# Patient Record
Sex: Female | Born: 1970
Health system: Southern US, Community
[De-identification: ages and names within clinical notes are randomized; demographics above are authoritative.]

## PROBLEM LIST (undated history)

## (undated) DIAGNOSIS — K219 Gastro-esophageal reflux disease without esophagitis: Secondary | ICD-10-CM

## (undated) DIAGNOSIS — E079 Disorder of thyroid, unspecified: Secondary | ICD-10-CM

## (undated) DIAGNOSIS — I10 Essential (primary) hypertension: Secondary | ICD-10-CM

## (undated) HISTORY — DX: Essential (primary) hypertension: I10

## (undated) HISTORY — DX: Gastro-esophageal reflux disease without esophagitis: K21.9

## (undated) HISTORY — DX: Disorder of thyroid, unspecified: E07.9

## (undated) HISTORY — PX: NO PAST SURGERIES: SHX2092

---

## 2020-08-27 ENCOUNTER — Ambulatory Visit (INDEPENDENT_AMBULATORY_CARE_PROVIDER_SITE_OTHER): Payer: No Typology Code available for payment source | Admitting: Medical-Surgical

## 2020-08-27 ENCOUNTER — Other Ambulatory Visit (HOSPITAL_COMMUNITY)
Admission: RE | Admit: 2020-08-27 | Discharge: 2020-08-27 | Disposition: A | Discharge status: Home / Self Care | Payer: No Typology Code available for payment source | Source: Ambulatory Visit | Attending: Medical-Surgical | Admitting: Medical-Surgical

## 2020-08-27 ENCOUNTER — Other Ambulatory Visit: Payer: Self-pay

## 2020-08-27 ENCOUNTER — Encounter: Payer: Self-pay | Admitting: Medical-Surgical

## 2020-08-27 VITALS — BP 97/65 | HR 65 | Temp 98.5°F | Ht 60.5 in | Wt 96.7 lb

## 2020-08-27 DIAGNOSIS — Z124 Encounter for screening for malignant neoplasm of cervix: Secondary | ICD-10-CM | POA: Diagnosis not present

## 2020-08-27 DIAGNOSIS — Z7689 Persons encountering health services in other specified circumstances: Secondary | ICD-10-CM

## 2020-08-27 DIAGNOSIS — Z1231 Encounter for screening mammogram for malignant neoplasm of breast: Secondary | ICD-10-CM

## 2020-08-27 DIAGNOSIS — R21 Rash and other nonspecific skin eruption: Secondary | ICD-10-CM

## 2020-08-27 DIAGNOSIS — Z114 Encounter for screening for human immunodeficiency virus [HIV]: Secondary | ICD-10-CM | POA: Diagnosis not present

## 2020-08-27 DIAGNOSIS — Z789 Other specified health status: Secondary | ICD-10-CM

## 2020-08-27 DIAGNOSIS — Z1159 Encounter for screening for other viral diseases: Secondary | ICD-10-CM

## 2020-08-27 DIAGNOSIS — Z0000 Encounter for general adult medical examination without abnormal findings: Secondary | ICD-10-CM

## 2020-08-27 DIAGNOSIS — E039 Hypothyroidism, unspecified: Secondary | ICD-10-CM

## 2020-08-27 DIAGNOSIS — Z1211 Encounter for screening for malignant neoplasm of colon: Secondary | ICD-10-CM | POA: Diagnosis not present

## 2020-08-27 DIAGNOSIS — Z8639 Personal history of other endocrine, nutritional and metabolic disease: Secondary | ICD-10-CM

## 2020-08-27 MED ORDER — NYSTATIN 100000 UNIT/GM EX POWD: 1.0000 "application " | Freq: Three times a day (TID) | CUTANEOUS | 0 refills | Status: DC

## 2020-08-27 MED ORDER — CEPHALEXIN 500 MG PO CAPS: 500.0000 mg | ORAL_CAPSULE | Freq: Two times a day (BID) | ORAL | 0 refills | Status: DC

## 2020-08-27 NOTE — Patient Instructions (Signed)
Preventive Care 48-48 Years Old, Female Preventive care refers to lifestyle choices and visits with your health care provider that can promote health and wellness. This includes:  A yearly physical exam. This is also called an annual wellness visit.  Regular dental and eye exams.  Immunizations.  Screening for certain conditions.  Healthy lifestyle choices, such as: ? Eating a healthy diet. ? Getting regular exercise. ? Not using drugs or products that contain nicotine and tobacco. ? Limiting alcohol use. What can I expect for my preventive care visit? Physical exam Your health care provider will check your:  Height and weight. These may be used to calculate your BMI (body mass index). BMI is a measurement that tells if you are at a healthy weight.  Heart rate and blood pressure.  Body temperature.  Skin for abnormal spots. Counseling Your health care provider may ask you questions about your:  Past medical problems.  Family's medical history.  Alcohol, tobacco, and drug use.  Emotional well-being.  Home life and relationship well-being.  Sexual activity.  Diet, exercise, and sleep habits.  Work and work Statistician.  Access to firearms.  Method of birth control.  Menstrual cycle.  Pregnancy history. What immunizations do I need? Vaccines are usually given at various ages, according to a schedule. Your health care provider will recommend vaccines for you based on your age, medical history, and lifestyle or other factors, such as travel or where you work.   What tests do I need? Blood tests  Lipid and cholesterol levels. These may be checked every 5 years, or more often if you are over 3 years old.  Hepatitis C test.  Hepatitis B test. Screening  Lung cancer screening. You may have this screening every year starting at age 48 if you have a 30-pack-year history of smoking and currently smoke or have quit within the past 15 years.  Colorectal cancer  screening. ? All adults should have this screening starting at age 48 and continuing until age 17. ? Your health care provider may recommend screening at age 48 if you are at increased risk. ? You will have tests every 1-10 years, depending on your results and the type of screening test.  Diabetes screening. ? This is done by checking your blood sugar (glucose) after you have not eaten for a while (fasting). ? You may have this done every 1-3 years.  Mammogram. ? This may be done every 1-2 years. ? Talk with your health care provider about when you should start having regular mammograms. This may depend on whether you have a family history of breast cancer.  BRCA-related cancer screening. This may be done if you have a family history of breast, ovarian, tubal, or peritoneal cancers.  Pelvic exam and Pap test. ? This may be done every 3 years starting at age 48. ? Starting at age 48, this may be done every 5 years if you have a Pap test in combination with an HPV test. Other tests  STD (sexually transmitted disease) testing, if you are at risk.  Bone density scan. This is done to screen for osteoporosis. You may have this scan if you are at high risk for osteoporosis. Talk with your health care provider about your test results, treatment options, and if necessary, the need for more tests. Follow these instructions at home: Eating and drinking  Eat a diet that includes fresh fruits and vegetables, whole grains, lean protein, and low-fat dairy products.  Take vitamin and mineral supplements  as recommended by your health care provider.  Do not drink alcohol if: ? Your health care provider tells you not to drink. ? You are pregnant, may be pregnant, or are planning to become pregnant.  If you drink alcohol: ? Limit how much you have to 0-1 drink a day. ? Be aware of how much alcohol is in your drink. In the [place] one drink equals one 12 oz bottle of beer (355 mL), one 5 oz glass of  wine (148 mL), or one 1 oz glass of hard liquor (44 mL).   Lifestyle  Take daily care of your teeth and gums. Brush your teeth every morning and night with fluoride toothpaste. Floss one time each day.  Stay active. Exercise for at least 30 minutes 5 or more days each week.  Do not use any products that contain nicotine or tobacco, such as cigarettes, e-cigarettes, and chewing tobacco. If you need help quitting, ask your health care provider.  Do not use drugs.  If you are sexually active, practice safe sex. Use a condom or other form of protection to prevent STIs (sexually transmitted infections).  If you do not wish to become pregnant, use a form of birth control. If you plan to become pregnant, see your health care provider for a prepregnancy visit.  If told by your health care provider, take low-dose aspirin daily starting at age 48.  Find healthy ways to cope with stress, such as: ? Meditation, yoga, or listening to music. ? Journaling. ? Talking to a trusted person. ? Spending time with friends and family. Safety  Always wear your seat belt while driving or riding in a vehicle.  Do not drive: ? If you have been drinking alcohol. Do not ride with someone who has been drinking. ? When you are tired or distracted. ? While texting.  Wear a helmet and other protective equipment during sports activities.  If you have firearms in your house, make sure you follow all gun safety procedures. What's next?  Visit your health care provider once a year for an annual wellness visit.  Ask your health care provider how often you should have your eyes and teeth checked.  Stay up to date on all vaccines. This information is not intended to replace advice given to you by your health care provider. Make sure you discuss any questions you have with your health care provider. Document Revised: 03/12/2020 Document Reviewed: 02/17/2018 Elsevier Patient Education  2021 Elsevier Inc.  

## 2020-08-27 NOTE — Progress Notes (Signed)
New Patient Office Visit  Subjective:  Patient ID: Jocelyn Lewis, female    DOB: [birth year]  Age: 50 y.o. MRN: [id number]  CC:  Chief Complaint  Patient presents with   Establish Care    HPI Jocelyn Lewis presents to establish care.   Hypothyroidism- treated with levothyroxine 59mcg daily for the last 3-4 years.  Has TSH checked at least once yearly.  History of iron deficiency.  Takes iron several times weekly by mouth.  History of vitamin D deficiency.  Takes vitamin D approximately 3 days/week.  Dentist: last about a year ago Eye exam: overdue, has not had one, on the to do list Diet: Strict vegetarian, no meat, no eggs, no seafood Pap smear: due, done today Mammogram: due, ordered today Colon cancer screening: prefers to defer today COVID: done, no booster yesterday  Past Medical History:  Diagnosis Date   Thyroid disease     Past Surgical History:  Procedure Laterality Date   NO PAST SURGERIES     Family History  Problem Relation Age of Onset   Diabetes Other    Hypertension Other    Social History   Socioeconomic History   Marital status: Married    Spouse name: Not on file   Number of children: Not on file   Years of education: Not on file   Highest education level: Not on file  Occupational History   Not on file  Tobacco Use   Smoking status: Never Smoker   Smokeless tobacco: Never Used  Vaping Use   Vaping Use: Never used  Substance and Sexual Activity   Alcohol use: Never   Drug use: Never   Sexual activity: Yes    Partners: Male    Birth control/protection: Surgical    Comment: husband had a vasectomy  Other Topics Concern   Not on file  Social History Narrative   Not on file   Social Determinants of Health   Financial Resource Strain: Not on file  Food Insecurity: Not on file  Transportation Needs: Not on file  Physical Activity: Not on file  Stress: Not on file  Social Connections: Not on file  Intimate Partner  Violence: Not on file    ROS Review of Systems  Constitutional: Negative for chills, fatigue, fever and unexpected weight change.  Respiratory: Negative for cough, chest tightness, shortness of breath and wheezing.   Cardiovascular: Negative for chest pain, palpitations and leg swelling.  Gastrointestinal: Negative for abdominal pain, constipation, diarrhea, nausea and vomiting.  Endocrine: Positive for cold intolerance. Negative for heat intolerance, polydipsia, polyphagia and polyuria.  Genitourinary: Negative for dysuria, frequency and urgency.  Allergic/Immunologic: Negative for environmental allergies and food allergies.  Neurological: Negative for dizziness, light-headedness and headaches.  Hematological: Does not bruise/bleed easily.  Psychiatric/Behavioral: Negative for dysphoric mood, self-injury, sleep disturbance and suicidal ideas. The patient is not nervous/anxious.     Objective:   Today's Vitals: BP 97/65    Pulse 65    Temp 98.5 F (36.9 C)    Ht 5' 0.5" (1.537 m)    Wt 96 lb 11.2 oz (43.9 kg)    LMP 08/16/2020    SpO2 100%    BMI 18.57 kg/m   Physical Exam Vitals and nursing note reviewed. Exam conducted with a chaperone present.  Constitutional:      General: She is not in acute distress.    Appearance: Normal appearance. She is not ill-appearing.  HENT:     Head: Normocephalic and atraumatic.  Right Ear: Tympanic membrane, ear canal and external ear normal. There is no impacted cerumen.     Left Ear: Tympanic membrane, ear canal and external ear normal. There is no impacted cerumen.     Nose: Nose normal. No congestion or rhinorrhea.     Right Turbinates: Not swollen.     Left Turbinates: Not swollen.     Right Sinus: No maxillary sinus tenderness or frontal sinus tenderness.     Left Sinus: No maxillary sinus tenderness or frontal sinus tenderness.     Mouth/Throat:     Mouth: Mucous membranes are moist. No oral lesions.     Pharynx: No oropharyngeal  exudate or posterior oropharyngeal erythema.     Tonsils: No tonsillar exudate or tonsillar abscesses.  Eyes:     General:        Right eye: No discharge.        Left eye: No discharge.     Extraocular Movements: Extraocular movements intact.     Conjunctiva/sclera: Conjunctivae normal.     Pupils: Pupils are equal, round, and reactive to light.  Neck:     Thyroid: No thyromegaly.     Vascular: No carotid bruit.  Cardiovascular:     Rate and Rhythm: Normal rate and regular rhythm.     Pulses: Normal pulses.     Heart sounds: Normal heart sounds. No murmur heard. No friction rub. No gallop.   Pulmonary:     Effort: Pulmonary effort is normal. No respiratory distress.     Breath sounds: Normal breath sounds. No decreased breath sounds or wheezing.  Chest:  Breasts: Breasts are symmetrical.     Right: Normal.     Left: Normal.    Abdominal:     General: Bowel sounds are normal. There is no distension.     Palpations: Abdomen is soft. There is no hepatomegaly, splenomegaly or mass.     Tenderness: There is no abdominal tenderness. There is no right CVA tenderness, left CVA tenderness, guarding or rebound.  Genitourinary:    General: Normal vulva.     Pubic Area: No rash.      Vagina: No vaginal discharge.     Cervix: No friability.     Uterus: Normal. Not tender and no uterine prolapse.      Adnexa: Right adnexa normal and left adnexa normal.       Right: No mass or tenderness.         Left: No mass or tenderness.       Rectum: Normal.  Musculoskeletal:        General: No swelling or tenderness. Normal range of motion.     Right shoulder: Normal.     Left shoulder: Normal.     Right upper arm: Normal.     Left upper arm: Normal.     Right elbow: Normal.     Left elbow: Normal.     Right forearm: Normal.     Left forearm: Normal.     Right wrist: Normal.     Left wrist: Normal.     Right hand: Normal.     Left hand: Normal.     Cervical back: Normal, normal range of  motion and neck supple. No tenderness.     Thoracic back: Normal.     Lumbar back: Normal.     Right lower leg: Normal. No edema.     Left lower leg: Normal. No edema.     Right ankle: Normal.  Right Achilles Tendon: Normal.     Left ankle: Normal.     Left Achilles Tendon: Normal.     Right foot: Normal.     Left foot: Normal.  Lymphadenopathy:     Cervical: No cervical adenopathy.  Skin:    General: Skin is warm and dry.     Coloration: Skin is not pale.     Findings: Rash (Bilateral axilla, erythematous, some flourescence under Woods lamp) present.  Neurological:     Mental Status: She is alert and oriented to person, place, and time.     Sensory: No sensory deficit.     Motor: No weakness.     Gait: Gait normal.     Deep Tendon Reflexes: Reflexes are normal and symmetric.  Psychiatric:        Attention and Perception: Attention normal.        Mood and Affect: Mood and affect normal.        Speech: Speech normal.        Behavior: Behavior normal.        Thought Content: Thought content normal.        Cognition and Memory: Cognition normal.        Judgment: Judgment normal.     Assessment & Plan:   1. Encounter to establish care Reviewed available information and discussed healthcare concerns with patient.  We are updating her on most of her preventive care today with the exception of colonoscopy which she would like to defer for now.  2. Screening for HIV (human immunodeficiency virus) Reports this has been done by occupational health so no need to add to blood work today.  3. Need for hepatitis C screening test Reports this has been done by occupational health so no need to add to blood work today.  4. Screen for colon cancer Discussed colonoscopy versus Cologuard.  She has no current symptoms and no family history and would like to defer this for now  5. Annual physical exam Checking CBC, CMP, and lipid panel today. - CBC with Differential/Platelet -  COMPLETE METABOLIC PANEL WITH GFR - Lipid panel  6. Cervical cancer screening Pap smear with HPV cotesting completed today - Cytology - PAP  7. Encounter for screening mammogram for malignant neoplasm of breast Mammogram ordered. - MM 3D SCREEN BREAST BILATERAL; Future  8. Acquired hypothyroidism Continue levothyroxine 88 mcg daily.  Checking TSH today - TSH  9. Skin rash Rash noted to bilateral axilla, red, irritated, and itchy.  Some patchy fluorescence under Woods lamp.  Treating with topical nystatin powder and oral Keflex for cellulitis/erysipelas.  Recommend limiting chemical use in the area until improvement occurs.  10. History of vitamin D deficiency Checking vitamin D.  Continue oral supplementation. - Vitamin D 1,25 dihydroxy  11. Strict vegetarian diet Checking vitamin B12 due to strict vegetarian diet. - Vitamin B12  12. History of iron deficiency Checking iron panel. - Fe+TIBC+Fer  Outpatient Encounter Medications as of 08/27/2020  Medication Sig   cephALEXin (KEFLEX) 500 MG capsule Take 1 capsule (500 mg total) by mouth 2 (two) times daily.   levothyroxine (SYNTHROID) 88 MCG tablet Take 88 mcg by mouth daily before breakfast.   nystatin (NYSTATIN) powder Apply 1 application topically 3 (three) times daily.   No facility-administered encounter medications on file as of 08/27/2020.    Follow-up: Return in about 1 year (around 08/27/2021) for annual physical exam or sooner if needed.   Clearnce Sorrel, DNP, APRN, FNP-BC [facility]

## 2020-08-28 LAB — CYTOLOGY - PAP
Comment: NEGATIVE
Diagnosis: NEGATIVE
High risk HPV: NEGATIVE

## 2020-08-30 ENCOUNTER — Encounter: Payer: Self-pay | Admitting: Medical-Surgical

## 2020-08-30 ENCOUNTER — Other Ambulatory Visit: Payer: Self-pay | Admitting: Medical-Surgical

## 2020-08-30 DIAGNOSIS — R17 Unspecified jaundice: Secondary | ICD-10-CM

## 2020-08-30 LAB — COMPLETE METABOLIC PANEL WITH GFR
AG Ratio: 1.7 (calc) (ref 1.0–2.5)
ALT: 17 U/L (ref 6–29)
AST: 20 U/L (ref 10–35)
Albumin: 4.5 g/dL (ref 3.6–5.1)
Alkaline phosphatase (APISO): 45 U/L (ref 31–125)
BUN: 12 mg/dL (ref 7–25)
CO2: 30 mmol/L (ref 20–32)
Calcium: 10.2 mg/dL (ref 8.6–10.2)
Chloride: 101 mmol/L (ref 98–110)
Creat: 0.61 mg/dL (ref 0.50–1.10)
GFR, Est African American: 125 mL/min/{1.73_m2} (ref >=60)
GFR, Est Non African American: 108 mL/min/{1.73_m2} (ref >=60)
Globulin: 2.7 g/dL (calc) (ref 1.9–3.7)
Glucose, Bld: 89 mg/dL (ref 65–99)
Potassium: 4.9 mmol/L (ref 3.5–5.3)
Sodium: 138 mmol/L (ref 135–146)
Total Bilirubin: 1.8 mg/dL — ABNORMAL HIGH (ref 0.2–1.2)
Total Protein: 7.2 g/dL (ref 6.1–8.1)

## 2020-08-30 LAB — CBC WITH DIFFERENTIAL/PLATELET
Absolute Monocytes: 435 cells/uL (ref 200–950)
Basophils Absolute: 41 cells/uL (ref 0–200)
Basophils Relative: 1 %
Eosinophils Absolute: 70 cells/uL (ref 15–500)
Eosinophils Relative: 1.7 %
HCT: 43.3 % (ref 35.0–45.0)
Hemoglobin: 14.8 g/dL (ref 11.7–15.5)
Lymphs Abs: 1784 cells/uL (ref 850–3900)
MCH: 32 pg (ref 27.0–33.0)
MCHC: 34.2 g/dL (ref 32.0–36.0)
MCV: 93.7 fL (ref 80.0–100.0)
MPV: 10.6 fL (ref 7.5–12.5)
Monocytes Relative: 10.6 %
Neutro Abs: 1771 cells/uL (ref 1500–7800)
Neutrophils Relative %: 43.2 %
Platelets: 290 10*3/uL (ref 140–400)
RBC: 4.62 10*6/uL (ref 3.80–5.10)
RDW: 11.4 % (ref 11.0–15.0)
Total Lymphocyte: 43.5 %
WBC: 4.1 10*3/uL (ref 3.8–10.8)

## 2020-08-30 LAB — LIPID PANEL
Cholesterol: 198 mg/dL (ref <200)
HDL: 78 mg/dL (ref >=50)
LDL Cholesterol (Calc): 105 mg/dL (calc) — ABNORMAL HIGH
Non-HDL Cholesterol (Calc): 120 mg/dL (calc) (ref <130)
Total CHOL/HDL Ratio: 2.5 (calc) (ref <5.0)
Triglycerides: 63 mg/dL (ref <150)

## 2020-08-30 LAB — TSH: TSH: 0.31 mIU/L — ABNORMAL LOW

## 2020-08-30 MED ORDER — LEVOTHYROXINE SODIUM 75 MCG PO TABS: 75.0000 ug | ORAL_TABLET | Freq: Every day | ORAL | 0 refills | Status: DC

## 2020-08-30 NOTE — Addendum Note (Signed)
Addended bySamuel Bouche on: 08/30/2020 07:10 AM   Modules accepted: Orders

## 2020-09-02 LAB — IRON,TIBC AND FERRITIN PANEL
%SAT: 49 % (calc) — ABNORMAL HIGH (ref 16–45)
Ferritin: 35 ng/mL (ref 16–232)
Iron: 181 ug/dL (ref 40–190)
TIBC: 373 mcg/dL (calc) (ref 250–450)

## 2020-09-02 LAB — VITAMIN D 1,25 DIHYDROXY
Vitamin D 1, 25 (OH)2 Total: 50 pg/mL (ref 18–72)
Vitamin D2 1, 25 (OH)2: <8 pg/mL
Vitamin D3 1, 25 (OH)2: 50 pg/mL

## 2020-09-02 LAB — VITAMIN B12: Vitamin B-12: 783 pg/mL (ref 200–1100)

## 2020-09-15 ENCOUNTER — Encounter: Payer: Self-pay | Admitting: Medical-Surgical

## 2020-09-23 ENCOUNTER — Encounter: Payer: Self-pay | Admitting: Medical-Surgical

## 2020-09-23 MED ORDER — NYSTATIN 100000 UNIT/GM EX POWD: 1.0000 "application " | Freq: Three times a day (TID) | CUTANEOUS | 0 refills | Status: DC

## 2020-11-25 ENCOUNTER — Other Ambulatory Visit: Payer: Self-pay

## 2020-11-25 ENCOUNTER — Ambulatory Visit (HOSPITAL_BASED_OUTPATIENT_CLINIC_OR_DEPARTMENT_OTHER)
Admission: RE | Admit: 2020-11-25 | Discharge: 2020-11-25 | Disposition: A | Discharge status: Home / Self Care | Payer: No Typology Code available for payment source | Source: Ambulatory Visit | Attending: Medical-Surgical | Admitting: Medical-Surgical

## 2020-11-25 ENCOUNTER — Encounter (HOSPITAL_BASED_OUTPATIENT_CLINIC_OR_DEPARTMENT_OTHER): Payer: Self-pay

## 2020-11-25 DIAGNOSIS — Z1231 Encounter for screening mammogram for malignant neoplasm of breast: Secondary | ICD-10-CM | POA: Diagnosis not present

## 2021-01-16 ENCOUNTER — Encounter: Payer: Self-pay | Admitting: Medical-Surgical

## 2021-01-17 LAB — COMPLETE METABOLIC PANEL WITH GFR
AG Ratio: 1.5 (calc) (ref 1.0–2.5)
ALT: 14 U/L (ref 6–29)
AST: 18 U/L (ref 10–35)
Albumin: 4.4 g/dL (ref 3.6–5.1)
Alkaline phosphatase (APISO): 44 U/L (ref 31–125)
BUN: 13 mg/dL (ref 7–25)
CO2: 31 mmol/L (ref 20–32)
Calcium: 10.3 mg/dL — ABNORMAL HIGH (ref 8.6–10.2)
Chloride: 101 mmol/L (ref 98–110)
Creat: 0.75 mg/dL (ref 0.50–0.99)
Globulin: 2.9 g/dL (calc) (ref 1.9–3.7)
Glucose, Bld: 66 mg/dL (ref 65–99)
Potassium: 4.3 mmol/L (ref 3.5–5.3)
Sodium: 139 mmol/L (ref 135–146)
Total Bilirubin: 1.3 mg/dL — ABNORMAL HIGH (ref 0.2–1.2)
Total Protein: 7.3 g/dL (ref 6.1–8.1)
eGFR: 98 mL/min/{1.73_m2} (ref >=60)

## 2021-01-17 LAB — TSH: TSH: 3.86 mIU/L

## 2021-01-18 ENCOUNTER — Other Ambulatory Visit: Payer: Self-pay | Admitting: Medical-Surgical

## 2021-01-18 MED ORDER — LEVOTHYROXINE SODIUM 75 MCG PO TABS
75.0000 ug | ORAL_TABLET | Freq: Every day | ORAL | 0 refills | Status: DC
  Filled 2021-01-18: qty 90, 90d supply, fill #0

## 2021-01-19 ENCOUNTER — Encounter: Payer: Self-pay | Admitting: Medical-Surgical

## 2021-01-20 ENCOUNTER — Other Ambulatory Visit: Payer: Self-pay | Admitting: Medical-Surgical

## 2021-01-20 ENCOUNTER — Other Ambulatory Visit (HOSPITAL_BASED_OUTPATIENT_CLINIC_OR_DEPARTMENT_OTHER): Payer: Self-pay

## 2021-01-20 MED ORDER — LEVOTHYROXINE SODIUM 50 MCG PO TABS
50.0000 ug | ORAL_TABLET | Freq: Every day | ORAL | 0 refills | Status: DC
  Filled 2021-01-20: qty 90, 90d supply, fill #0

## 2021-02-20 ENCOUNTER — Other Ambulatory Visit (HOSPITAL_BASED_OUTPATIENT_CLINIC_OR_DEPARTMENT_OTHER): Payer: Self-pay

## 2021-03-01 ENCOUNTER — Other Ambulatory Visit (HOSPITAL_BASED_OUTPATIENT_CLINIC_OR_DEPARTMENT_OTHER): Payer: Self-pay

## 2021-04-08 ENCOUNTER — Encounter: Payer: Self-pay | Admitting: Medical-Surgical

## 2021-04-08 DIAGNOSIS — E039 Hypothyroidism, unspecified: Secondary | ICD-10-CM

## 2021-04-15 ENCOUNTER — Other Ambulatory Visit: Payer: Self-pay | Admitting: Medical-Surgical

## 2021-04-15 ENCOUNTER — Encounter: Payer: Self-pay | Admitting: Medical-Surgical

## 2021-04-15 LAB — COMPLETE METABOLIC PANEL WITH GFR
AG Ratio: 1.5 (calc) (ref 1.0–2.5)
ALT: 19 U/L (ref 6–29)
AST: 21 U/L (ref 10–35)
Albumin: 4.7 g/dL (ref 3.6–5.1)
Alkaline phosphatase (APISO): 53 U/L (ref 31–125)
BUN: 13 mg/dL (ref 7–25)
CO2: 32 mmol/L (ref 20–32)
Calcium: 10.9 mg/dL — ABNORMAL HIGH (ref 8.6–10.2)
Chloride: 101 mmol/L (ref 98–110)
Creat: 0.7 mg/dL (ref 0.50–0.99)
Globulin: 3.1 g/dL (calc) (ref 1.9–3.7)
Glucose, Bld: 64 mg/dL — ABNORMAL LOW (ref 65–139)
Potassium: 4.2 mmol/L (ref 3.5–5.3)
Sodium: 140 mmol/L (ref 135–146)
Total Bilirubin: 1.5 mg/dL — ABNORMAL HIGH (ref 0.2–1.2)
Total Protein: 7.8 g/dL (ref 6.1–8.1)
eGFR: 107 mL/min/{1.73_m2} (ref >=60)

## 2021-04-15 LAB — TSH: TSH: 18.75 mIU/L — ABNORMAL HIGH

## 2021-04-15 MED ORDER — LEVOTHYROXINE SODIUM 75 MCG PO TABS: 75.0000 ug | ORAL_TABLET | Freq: Every day | ORAL | 0 refills | Status: DC

## 2021-05-17 ENCOUNTER — Emergency Department
Admission: EM | Admit: 2021-05-17 | Discharge: 2021-05-17 | Disposition: A | Discharge status: Home / Self Care | Payer: No Typology Code available for payment source | Source: Home / Self Care | Attending: Family Medicine | Admitting: Family Medicine

## 2021-05-17 ENCOUNTER — Other Ambulatory Visit: Payer: Self-pay

## 2021-05-17 ENCOUNTER — Encounter: Payer: Self-pay | Admitting: Emergency Medicine

## 2021-05-17 DIAGNOSIS — R058 Other specified cough: Secondary | ICD-10-CM

## 2021-05-17 DIAGNOSIS — J040 Acute laryngitis: Secondary | ICD-10-CM

## 2021-05-17 DIAGNOSIS — J111 Influenza due to unidentified influenza virus with other respiratory manifestations: Secondary | ICD-10-CM

## 2021-05-17 MED ORDER — BENZONATATE 200 MG PO CAPS: 200.0000 mg | ORAL_CAPSULE | Freq: Two times a day (BID) | ORAL | 0 refills | Status: DC | PRN

## 2021-05-17 MED ORDER — AZITHROMYCIN 250 MG PO TABS: 250.0000 mg | ORAL_TABLET | Freq: Every day | ORAL | 0 refills | Status: DC

## 2021-05-17 NOTE — ED Triage Notes (Signed)
Fever w/ body aches since last Saturday- lost her voice yesterday  Dry cough  OTC meds: mucinex, tylenol cold & flu, vit c, zinc & vitamin d Steam  & cough drops  Had a flu vaccine this year

## 2021-05-17 NOTE — ED Provider Notes (Signed)
[facility]    CSN: [id number] Arrival date & time: 05/17/21  1131      History   Chief Complaint Chief Complaint  Patient presents with   Fever   Hoarse    HPI Jocelyn Lewis is a 50 y.o. female.   HPI Patient is on her eighth day of her upper respiratory illness.  She has had cough cold runny nose and sore throat.  She has been using Mucinex, Tylenol Cold and flu, and a vitamin supplement.  She has used steam.  Cough drops.  Sipping on tea.  In spite of this her symptoms have not improved and yesterday she lost her voice.  She thinks it is from all the coughing.  She coughed so hard that her chest hurts.  She can speak barely above a whisper.  No one else at home is sick.  She is flu vaccinated.  She is COVID vaccinated.  Past Medical History:  Diagnosis Date   Thyroid disease     There are no problems to display for this patient.   Past Surgical History:  Procedure Laterality Date   NO PAST SURGERIES      OB History   No obstetric history on file.      Home Medications    Prior to Admission medications   Medication Sig Start Date End Date Taking? Authorizing Provider  azithromycin (ZITHROMAX) 250 MG tablet Take 1 tablet (250 mg total) by mouth daily. Take first 2 tablets together, then 1 every day until finished. 05/17/21  Yes Raylene Everts, MD  benzonatate (TESSALON) 200 MG capsule Take 1 capsule (200 mg total) by mouth 2 (two) times daily as needed for cough. 05/17/21  Yes Raylene Everts, MD  levothyroxine (SYNTHROID) 75 MCG tablet Take 1 tablet (75 mcg total) by mouth daily before breakfast. 04/15/21   Samuel Bouche, NP    Family History Family History  Problem Relation Age of Onset   Diabetes Other    Hypertension Other     Social History Social History   Tobacco Use   Smoking status: Never   Smokeless tobacco: Never  Vaping Use   Vaping Use: Never used  Substance Use Topics   Alcohol use: Never   Drug use: Never      Allergies   Patient has no known allergies.   Review of Systems Review of Systems See HPI  Physical Exam Triage Vital Signs ED Triage Vitals  Enc Vitals Group     BP 05/17/21 1301 111/75     Pulse Rate 05/17/21 1301 80     Resp 05/17/21 1301 17     Temp 05/17/21 1301 98.5 F (36.9 C)     Temp Source 05/17/21 1301 Oral     SpO2 05/17/21 1301 98 %     Weight 05/17/21 1302 93 lb (42.2 kg)     Height 05/17/21 1302 5' 0.5" (1.537 m)     Head Circumference --      Peak Flow --      Pain Score 05/17/21 1305 4     Pain Loc --      Pain Edu? --      Excl. in [place]? --    No data found.  Updated Vital Signs BP 111/75 (BP Location: Left Arm)   Pulse 80   Temp 98.5 F (36.9 C) (Oral)   Resp 17   Ht 5' 0.5" (1.537 m)   Wt 42.2 kg   LMP 05/03/2021 (Approximate)  SpO2 98%   BMI 17.86 kg/m      Physical Exam Constitutional:      General: She is not in acute distress.    Appearance: She is well-developed. She is ill-appearing.     Comments: Small woman.  Speaks in a whisper.  Appears tired  HENT:     Head: Normocephalic and atraumatic.     Right Ear: Tympanic membrane, ear canal and external ear normal.     Left Ear: Tympanic membrane, ear canal and external ear normal.     Nose: Nose normal. No congestion.     Mouth/Throat:     Pharynx: Posterior oropharyngeal erythema present.     Comments: Mouth slightly dry Eyes:     Conjunctiva/sclera: Conjunctivae normal.     Pupils: Pupils are equal, round, and reactive to light.  Cardiovascular:     Rate and Rhythm: Normal rate and regular rhythm.     Heart sounds: Normal heart sounds.  Pulmonary:     Effort: Pulmonary effort is normal. No respiratory distress.     Breath sounds: Normal breath sounds.  Abdominal:     General: There is no distension.     Palpations: Abdomen is soft.  Musculoskeletal:        General: Normal range of motion.     Cervical back: Normal range of motion.  Lymphadenopathy:     Cervical:  Cervical adenopathy present.  Skin:    General: Skin is warm and dry.  Neurological:     Mental Status: She is alert.  Psychiatric:        Mood and Affect: Mood normal.        Behavior: Behavior normal.     UC Treatments / Results  Labs (all labs ordered are listed, but only abnormal results are displayed) Labs Reviewed - No data to display  EKG   Radiology No results found.  Procedures Procedures (including critical care time)  Medications Ordered in UC Medications - No data to display  Initial Impression / Assessment and Plan / UC Course  I have reviewed the triage vital signs and the nursing notes.  Pertinent labs & imaging results that were available during my care of the patient were reviewed by me and considered in my medical decision making (see chart for details).     Final Clinical Impressions(s) / UC Diagnoses   Final diagnoses:  Influenza-like illness  Laryngitis, acute  Productive cough     Discharge Instructions      Make sure you are drinking lots of water Run a humidifier if you have 1 Try to talk as little as possible Take the antibiotic as directed.  2 pills today then 1 a day until gone Take the cough medicine 2 times a day.  This will help reduce your coughing and irritation in your throat    ED Prescriptions     Medication Sig Dispense Auth. Provider   benzonatate (TESSALON) 200 MG capsule Take 1 capsule (200 mg total) by mouth 2 (two) times daily as needed for cough. 20 capsule Raylene Everts, MD   azithromycin (ZITHROMAX) 250 MG tablet Take 1 tablet (250 mg total) by mouth daily. Take first 2 tablets together, then 1 every day until finished. 6 tablet Raylene Everts, MD      PDMP not reviewed this encounter.   Raylene Everts, MD 05/17/21 1345

## 2021-05-17 NOTE — Discharge Instructions (Signed)
Make sure you are drinking lots of water Run a humidifier if you have 1 Try to talk as little as possible Take the antibiotic as directed.  2 pills today then 1 a day until gone Take the cough medicine 2 times a day.  This will help reduce your coughing and irritation in your throat

## 2021-07-10 ENCOUNTER — Encounter: Payer: Self-pay | Admitting: Medical-Surgical

## 2021-07-10 DIAGNOSIS — E039 Hypothyroidism, unspecified: Secondary | ICD-10-CM

## 2021-07-16 LAB — PARATHYROID HORMONE, INTACT (NO CA): PTH: 63 pg/mL (ref 16–77)

## 2021-07-16 LAB — TSH: TSH: 1.75 mIU/L

## 2021-07-17 ENCOUNTER — Other Ambulatory Visit: Payer: Self-pay | Admitting: Medical-Surgical

## 2021-07-17 ENCOUNTER — Encounter: Payer: Self-pay | Admitting: Medical-Surgical

## 2021-07-17 MED ORDER — LEVOTHYROXINE SODIUM 75 MCG PO TABS: 75.0000 ug | ORAL_TABLET | Freq: Every day | ORAL | 1 refills | Status: DC

## 2021-10-03 ENCOUNTER — Ambulatory Visit (INDEPENDENT_AMBULATORY_CARE_PROVIDER_SITE_OTHER): Payer: No Typology Code available for payment source | Admitting: Gastroenterology

## 2021-10-03 ENCOUNTER — Encounter: Payer: Self-pay | Admitting: Gastroenterology

## 2021-10-03 VITALS — BP 104/72 | HR 82 | Ht 60.5 in | Wt 95.0 lb

## 2021-10-03 DIAGNOSIS — Z1212 Encounter for screening for malignant neoplasm of rectum: Secondary | ICD-10-CM | POA: Diagnosis not present

## 2021-10-03 DIAGNOSIS — R1319 Other dysphagia: Secondary | ICD-10-CM | POA: Diagnosis not present

## 2021-10-03 DIAGNOSIS — Z1211 Encounter for screening for malignant neoplasm of colon: Secondary | ICD-10-CM

## 2021-10-03 DIAGNOSIS — K219 Gastro-esophageal reflux disease without esophagitis: Secondary | ICD-10-CM

## 2021-10-03 MED ORDER — OMEPRAZOLE 20 MG PO CPDR: 20.0000 mg | DELAYED_RELEASE_CAPSULE | Freq: Every day | ORAL | 4 refills | Status: DC

## 2021-10-03 NOTE — Progress Notes (Signed)
? ? ?Chief Complaint: For GI eval ? ?Referring Provider:  Samuel Bouche, NP    ? ? ?ASSESSMENT AND PLAN;  ? ?#1. GERD with eso dysphagia ? ?#2. Colorectal cancer screening ? ?Plan: ?-Omeprazole '20mg'$  po QD #90, 4 RF.  She can take it half an hour before supper. ?-EGD with dil/colon for further eval ? ?Discussed procedures in detail. ?HPI:   ? ?Jocelyn Lewis is a 51 y.o. female  ?Very pleasant CMA, who is now vegan ?Husband Jocelyn Lewis is a patient of ours. ? ?Longstanding history of mild heartburn ?Now with mild dysphagia-has to eat slow, she feels " food going down slowly" when she swallows.  Mostly with solids.  Her coworkers would be done and she would still be eating. ?No odynophagia ?No weight loss ?No melena or hematochezia ?Was somewhat better when she was taking omeprazole-stopped due to concerns of interaction with Synthroid. ? ?She denies having any significant diarrhea or constipation.  Occasional bloating especially if she eats sweet foods. ?She is due for age-related colorectal cancer screening as well ? ?She is due for physical.  She has sent message to [facility] regarding that as well. ? ?Previously her calcium level was elevated.  PTH was normal. ?She is currently taking thyroid medications as well.  TSH being followed. ? ?Wt Readings from Last 3 Encounters:  ?10/03/21 95 lb (43.1 kg)  ?05/17/21 93 lb (42.2 kg)  ?08/27/20 96 lb 11.2 oz (43.9 kg)  ? ? ? ?Past Medical History:  ?Diagnosis Date  ? Thyroid disease   ? ? ?Past Surgical History:  ?Procedure Laterality Date  ? NO PAST SURGERIES    ? ? ?Family History  ?Problem Relation Age of Onset  ? Diabetes Other   ? Hypertension Other   ? Colon cancer Neg Hx   ? Esophageal cancer Neg Hx   ? ? ?Social History  ? ?Tobacco Use  ? Smoking status: Never  ? Smokeless tobacco: Never  ?Vaping Use  ? Vaping Use: Never used  ?Substance Use Topics  ? Alcohol use: Never  ? Drug use: Never  ? ? ?Current Outpatient Medications  ?Medication Sig Dispense Refill  ? levothyroxine  (SYNTHROID) 75 MCG tablet Take 1 tablet (75 mcg total) by mouth daily before breakfast. 90 tablet 1  ? ?No current facility-administered medications for this visit.  ? ? ?No Known Allergies ? ?Review of Systems:  ?Constitutional: Denies fever, chills, diaphoresis, appetite change and fatigue.  ?HEENT: Denies photophobia, eye pain, redness, hearing loss, ear pain, congestion, sore throat, rhinorrhea, sneezing, mouth sores, neck pain, neck stiffness and tinnitus.   ?Respiratory: Denies SOB, DOE, cough, chest tightness,  and wheezing.   ?Cardiovascular: Denies chest pain, palpitations and leg swelling.  ?Genitourinary: Denies dysuria, urgency, frequency, hematuria, flank pain and difficulty urinating.  ?Musculoskeletal: Denies myalgias, back pain, joint swelling, arthralgias and gait problem.  ?Skin: No rash.  ?Neurological: Denies dizziness, seizures, syncope, weakness, light-headedness, numbness and headaches.  ?Hematological: Denies adenopathy. Easy bruising, personal or family bleeding history  ?Psychiatric/Behavioral: No anxiety or depression ? ?  ? ?Physical Exam:   ? ?BP 104/72   Pulse 82   Ht 5' 0.5" (1.537 m)   Wt 95 lb (43.1 kg)   BMI 18.25 kg/m?  ?Wt Readings from Last 3 Encounters:  ?10/03/21 95 lb (43.1 kg)  ?05/17/21 93 lb (42.2 kg)  ?08/27/20 96 lb 11.2 oz (43.9 kg)  ? ?Constitutional:  Well-developed, in no acute distress. ?Psychiatric: Normal mood and affect. Behavior is normal. ?HEENT:  Pupils normal.  Conjunctivae are normal. No scleral icterus. ?Cardiovascular: Normal rate, regular rhythm. No edema ?Pulmonary/chest: Effort normal and breath sounds normal. No wheezing, rales or rhonchi. ?Abdominal: Soft, nondistended. Nontender. Bowel sounds active throughout. There are no masses palpable. No hepatomegaly. ?Rectal: Deferred ?Neurological: Alert and oriented to person place and time. ?Skin: Skin is warm and dry. No rashes noted. ? ?Data Reviewed: I have personally reviewed following labs and  imaging studies ? ?CBC: ? ?  Latest Ref Rng & Units 08/29/2020  ?  7:05 AM  ?CBC  ?WBC 3.8 - 10.8 Thousand/uL 4.1    ?Hemoglobin 11.7 - 15.5 g/dL 14.8    ?Hematocrit 35.0 - 45.0 % 43.3    ?Platelets 140 - 400 Thousand/uL 290    ? ? ?CMP: ? ?  Latest Ref Rng & Units 04/14/2021  ?  7:36 AM 01/17/2021  ?  7:20 AM 08/29/2020  ?  7:05 AM  ?CMP  ?Glucose 65 - 139 mg/dL 64   66   89    ?BUN 7 - 25 mg/dL '13   13   12    '$ ?Creatinine 0.50 - 0.99 mg/dL 0.70   0.75   0.61    ?Sodium 135 - 146 mmol/L 140   139   138    ?Potassium 3.5 - 5.3 mmol/L 4.2   4.3   4.9    ?Chloride 98 - 110 mmol/L 101   101   101    ?CO2 20 - 32 mmol/L 32   31   30    ?Calcium 8.6 - 10.2 mg/dL 10.9   10.3   10.2    ?Total Protein 6.1 - 8.1 g/dL 7.8   7.3   7.2    ?Total Bilirubin 0.2 - 1.2 mg/dL 1.5   1.3   1.8    ?AST 10 - 35 U/L '21   18   20    '$ ?ALT 6 - 29 U/L '19   14   17    '$ ? ? ? ? ? ?Carmell Austria, MD 10/03/2021, 9:25 AM ? ?Cc: Samuel Bouche, NP ? ? ?

## 2021-10-03 NOTE — Patient Instructions (Addendum)
If you are age 49 or older, your body mass index should be between 23-30. Your Body mass index is 18.25 kg/m?Marland Kitchen If this is out of the aforementioned range listed, please consider follow up with your Primary Care Provider. ? ?If you are age 73 or younger, your body mass index should be between 19-25. Your Body mass index is 18.25 kg/m?Marland Kitchen If this is out of the aformentioned range listed, please consider follow up with your Primary Care Provider.  ? ?________________________________________________________ ? ?The [facility] providers would like to encourage you to use [facility] to communicate with providers for non-urgent requests or questions.  Due to long hold times on the telephone, sending your provider a message by [facility] may be a faster and more efficient way to get a response.  Please allow 48 business hours for a response.  Please remember that this is for non-urgent requests.  ?_______________________________________________________ ? ?We have sent the following medications to your pharmacy for you to pick up at your convenience: ?Omeprazole ? ?Thank you, ? ?Dr. Jackquline Denmark ? ? ? ? ? ?We want to thank you for trusting [facility] with your care. All of our staff and providers value the relationships we have built with our patients, and it is an honor to care for you.  ? ?We are writing to let you know that [facility] will close on Nov 03, 2021, and we invite you to continue to see Dr. Carmell Austria and Gerrit Heck at the [facility] [place] office location. We are consolidating our serices at these [facility] practices to better provide care. Our office staff will work with you to ensure a seamless transition.  ? ?Gerrit Heck, DO -Dr. Bryan Lemma will be movig to [facility] at [address], [place], [place] [address], effective Nov 03, 2021.  Contact [phone] to schedule an appointment with him.  ? ?Carmell Austria, MD- Dr. Lyndel Safe will  be movig to [facility] at [address], [place], [place] [address], effective Nov 03, 2021.  Contact [phone] to schedule an appointment with him.  ? ?Requesting Medical Records ?If you need to request your medical records, please follow the instructions below. Your medical records are confidential, and a copy can be transferred to another provider or released to you or another person you designate only with your permission. ? ?There are several ways to request your medical records: ?Requests for medical records can be submitted through our practice.   ?You can also request your records electronically, in your MyChart account by selecting the ?Request Health Records? tab.  ?If you need additional information on how to request records, please go to [email or web address]/, choose Patient Information, then select Request Medical Records. ?To make an appointment or if you have any questions about your health care needs, please contact our office at [phone] and one of our staff members will be glad to assist you. ?[facility] is committed to providing exceptional care for you and our community. Thank you for allowing [place] to serve your health care needs. ?Sincerely, ? ?Windy Canny, Director [facility] ? also offers convenient virtual care options. Sore throat? Sinus problems? Cold or flu symptoms? Get care from the comfort of home with [facility] Video Visits and e-Visits. Learn more about the non-emergency conditions treated and start your virtual visit at [email or web address] ? ?

## 2021-10-06 ENCOUNTER — Other Ambulatory Visit: Payer: Self-pay

## 2021-10-06 MED ORDER — LEVOTHYROXINE SODIUM 75 MCG PO TABS: 75.0000 ug | ORAL_TABLET | Freq: Every day | ORAL | 0 refills | Status: DC

## 2021-10-10 ENCOUNTER — Ambulatory Visit: Payer: No Typology Code available for payment source | Admitting: Gastroenterology

## 2021-10-14 ENCOUNTER — Telehealth: Payer: Self-pay | Admitting: Medical-Surgical

## 2021-10-14 DIAGNOSIS — Z1231 Encounter for screening mammogram for malignant neoplasm of breast: Secondary | ICD-10-CM

## 2021-10-14 NOTE — Telephone Encounter (Signed)
Mammogram ordered

## 2021-10-14 NOTE — Telephone Encounter (Signed)
Patient requested a mammogram referral. ?

## 2021-10-16 IMAGING — MG MM DIGITAL SCREENING BILAT W/ TOMO AND CAD
8 series · 9 of 24 positions shown · non-contrast
Comparison: None.

CLINICAL DATA: Screening.

EXAM:
DIGITAL SCREENING BILATERAL MAMMOGRAM WITH TOMOSYNTHESIS AND CAD
TECHNIQUE: Bilateral screening digital craniocaudal and mediolateral oblique
mammograms were obtained. Bilateral screening digital breast
tomosynthesis was performed. The images were evaluated with
computer-aided detection.

[L CC synth-2D]
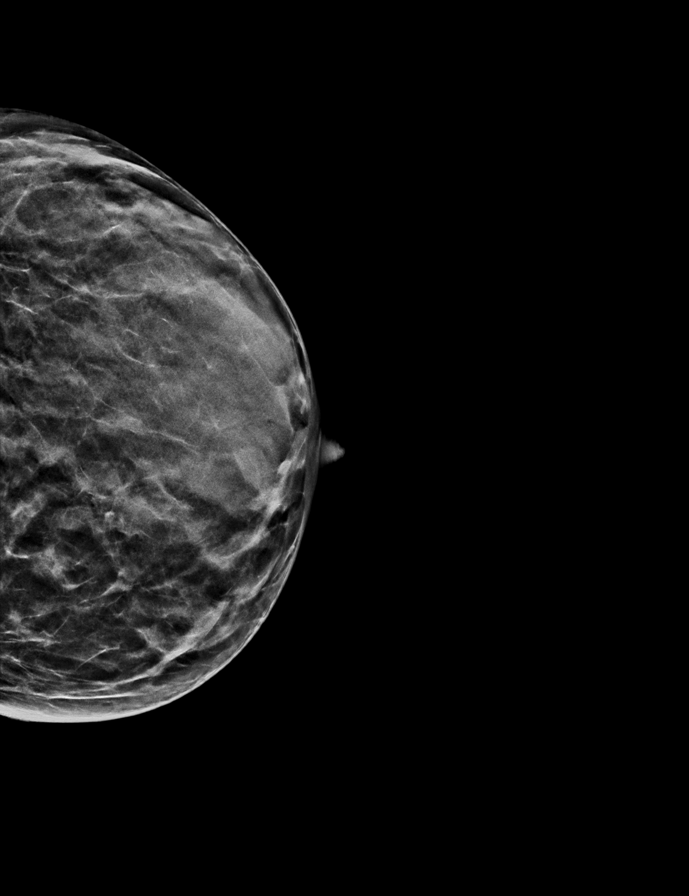

[R CC synth-2D]
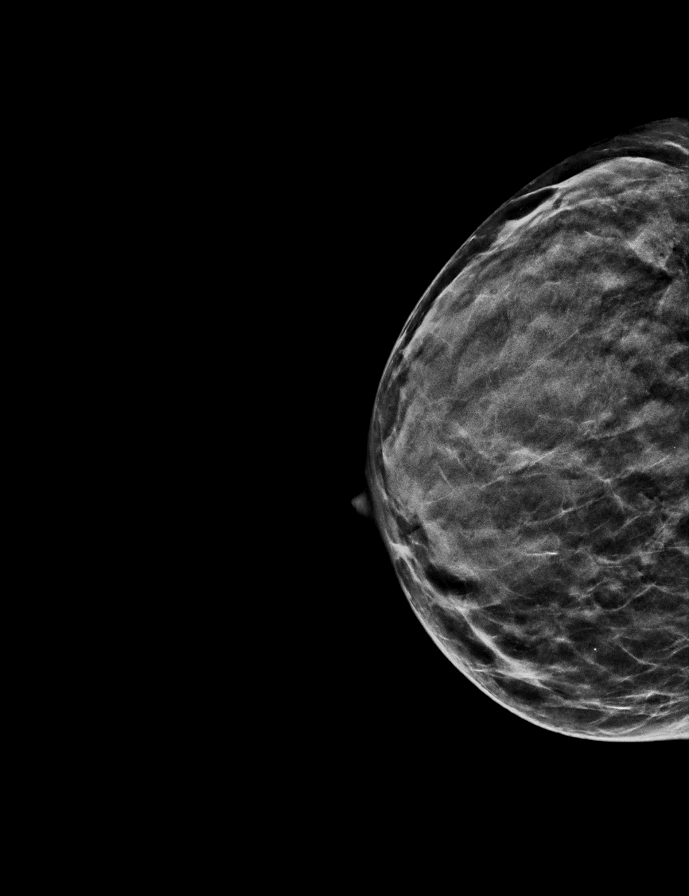

[L MLO synth-2D]
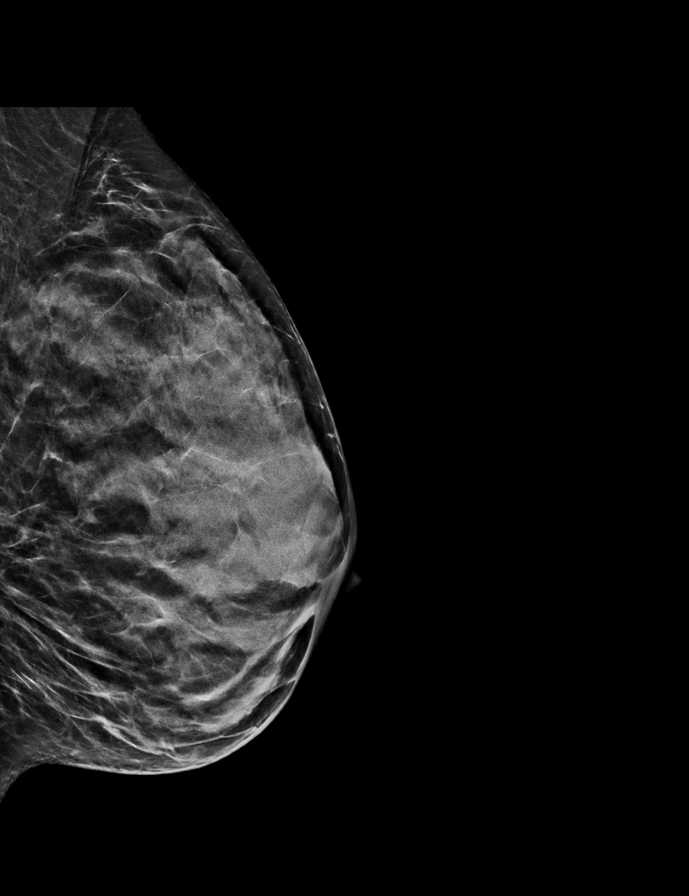

[R MLO synth-2D]
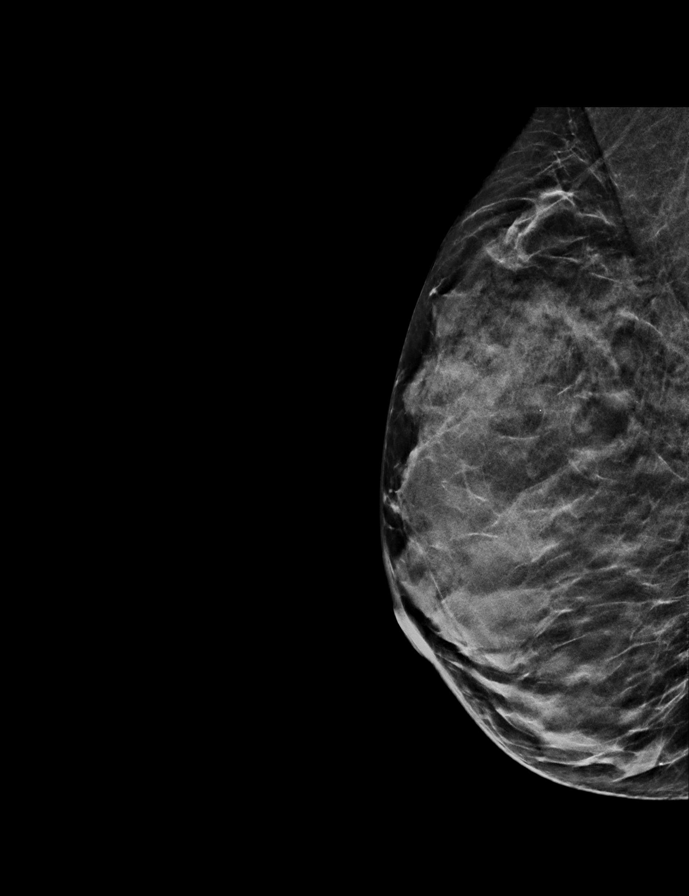

[R MLO tomo · 2 of 42 frames shown]
[frame 14/42]
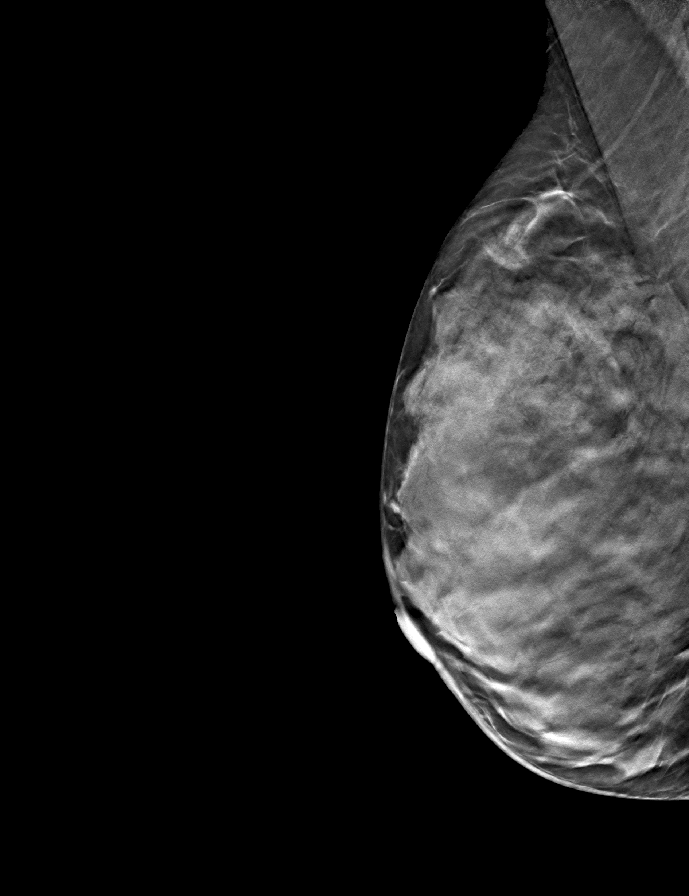
[frame 21/42]
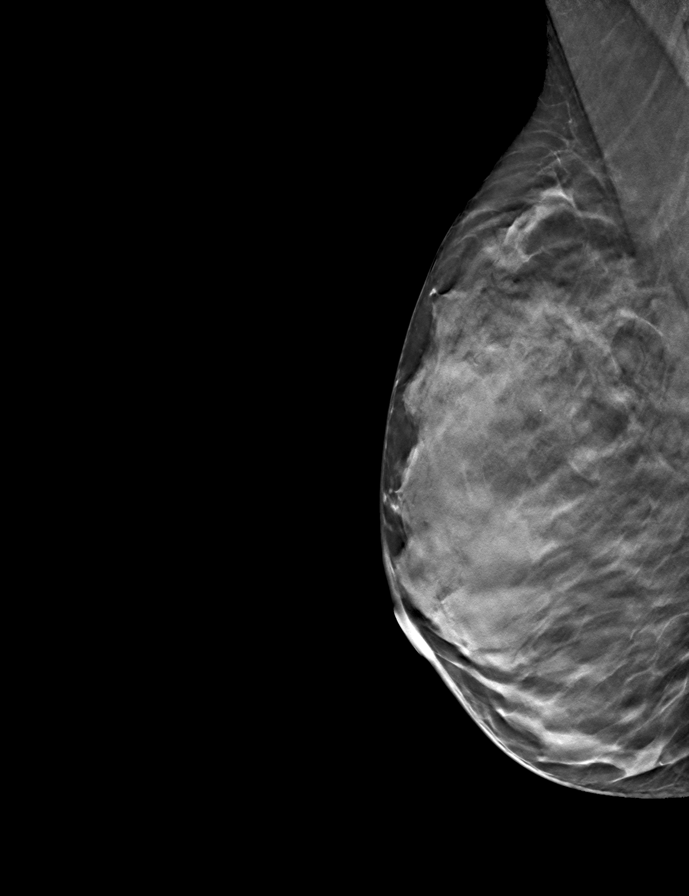

[L MLO tomo · tomo slice 23/45.0]
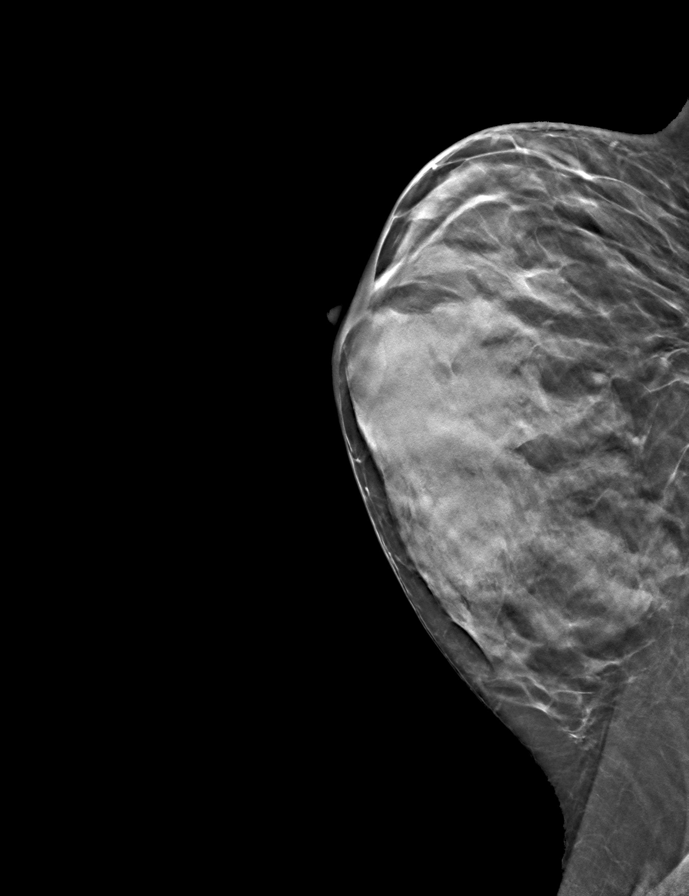

[R CC tomo · tomo slice 21/42.0]
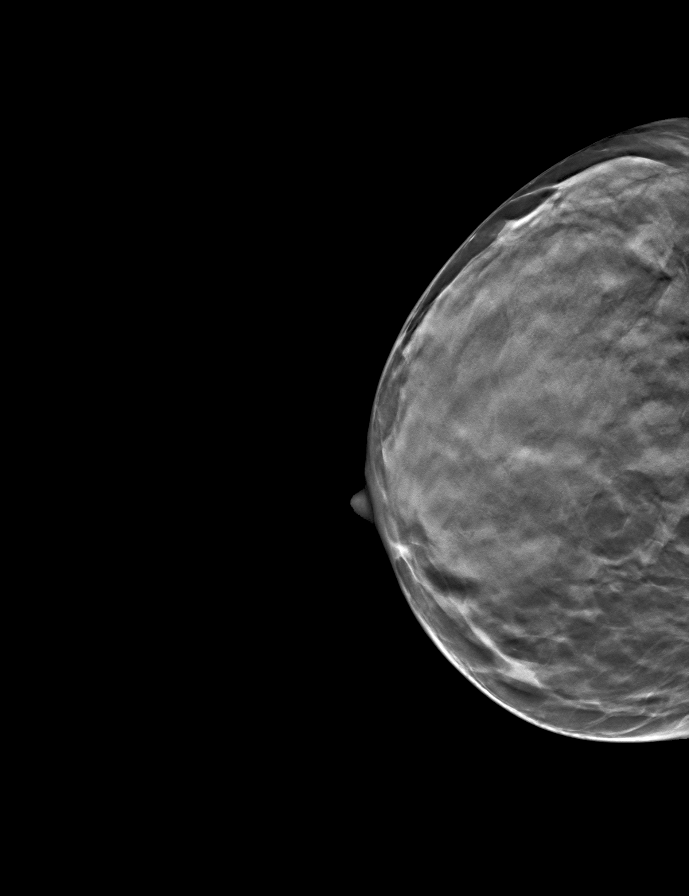

[L CC tomo · tomo slice 21/42.0]
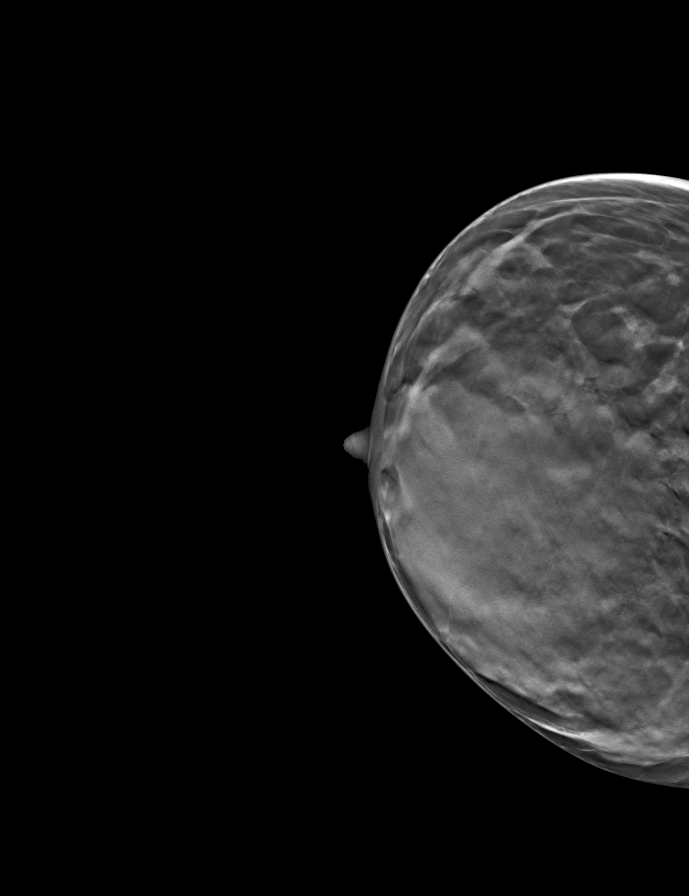

[9 of 24 positions shown; findings below may reference images not displayed]

ACR Breast Density Category d: The breast tissue is extremely dense,
which lowers the sensitivity of mammography.
FINDINGS: There are no findings suspicious for malignancy. The images were
evaluated with computer-aided detection.
IMPRESSION: No mammographic evidence of malignancy. A result letter of this
screening mammogram will be mailed directly to the patient.

RECOMMENDATION:
Screening mammogram in one year. (Code:CA-B-HJO)

BI-RADS CATEGORY  1: Negative.

## 2021-10-20 ENCOUNTER — Other Ambulatory Visit: Payer: Self-pay

## 2021-10-20 DIAGNOSIS — R1319 Other dysphagia: Secondary | ICD-10-CM

## 2021-10-20 DIAGNOSIS — K219 Gastro-esophageal reflux disease without esophagitis: Secondary | ICD-10-CM

## 2021-10-20 DIAGNOSIS — Z1211 Encounter for screening for malignant neoplasm of colon: Secondary | ICD-10-CM

## 2021-11-20 HISTORY — PX: COLONOSCOPY: SHX174

## 2021-12-01 ENCOUNTER — Other Ambulatory Visit (HOSPITAL_COMMUNITY): Payer: Self-pay

## 2021-12-01 ENCOUNTER — Other Ambulatory Visit: Payer: Self-pay

## 2021-12-01 MED ORDER — OMEPRAZOLE 20 MG PO CPDR
20.0000 mg | DELAYED_RELEASE_CAPSULE | Freq: Every day | ORAL | 4 refills | Status: DC
  Filled 2021-12-01: qty 90, 90d supply, fill #0

## 2021-12-15 ENCOUNTER — Ambulatory Visit (AMBULATORY_SURGERY_CENTER): Payer: No Typology Code available for payment source | Admitting: Gastroenterology

## 2021-12-15 ENCOUNTER — Encounter: Payer: Self-pay | Admitting: Gastroenterology

## 2021-12-15 VITALS — BP 103/65 | HR 66 | Temp 97.8°F | Resp 16 | Ht 60.0 in | Wt 95.0 lb

## 2021-12-15 DIAGNOSIS — K2970 Gastritis, unspecified, without bleeding: Secondary | ICD-10-CM | POA: Diagnosis not present

## 2021-12-15 DIAGNOSIS — Z1212 Encounter for screening for malignant neoplasm of rectum: Secondary | ICD-10-CM | POA: Diagnosis not present

## 2021-12-15 DIAGNOSIS — Z1211 Encounter for screening for malignant neoplasm of colon: Secondary | ICD-10-CM

## 2021-12-15 DIAGNOSIS — D122 Benign neoplasm of ascending colon: Secondary | ICD-10-CM

## 2021-12-15 DIAGNOSIS — R1310 Dysphagia, unspecified: Secondary | ICD-10-CM | POA: Diagnosis not present

## 2021-12-15 DIAGNOSIS — K219 Gastro-esophageal reflux disease without esophagitis: Secondary | ICD-10-CM | POA: Diagnosis present

## 2021-12-15 DIAGNOSIS — K229 Disease of esophagus, unspecified: Secondary | ICD-10-CM | POA: Diagnosis not present

## 2021-12-15 DIAGNOSIS — K2950 Unspecified chronic gastritis without bleeding: Secondary | ICD-10-CM | POA: Diagnosis not present

## 2021-12-15 MED ORDER — SODIUM CHLORIDE 0.9 % IV SOLN: 500.0000 mL | Freq: Once | INTRAVENOUS | Status: DC

## 2021-12-16 ENCOUNTER — Telehealth: Payer: Self-pay | Admitting: *Deleted

## 2021-12-18 ENCOUNTER — Ambulatory Visit (INDEPENDENT_AMBULATORY_CARE_PROVIDER_SITE_OTHER): Payer: No Typology Code available for payment source

## 2021-12-18 ENCOUNTER — Encounter: Payer: Self-pay | Admitting: Gastroenterology

## 2021-12-18 DIAGNOSIS — Z1231 Encounter for screening mammogram for malignant neoplasm of breast: Secondary | ICD-10-CM | POA: Diagnosis not present

## 2021-12-21 NOTE — Progress Notes (Signed)
Complete physical exam  Patient: Jocelyn Lewis   DOB: 02/27/1971   51 y.o. Female  MRN: [id number]  Subjective:    Chief Complaint  Patient presents with   Annual Exam    Jocelyn Lewis is a 51 y.o. female who presents today for a complete physical exam. She reports consuming a  vegan  diet. Home exercise routine includes yoga daily x 10 minutes and walking at least twice weekly. She generally feels well. She reports sleeping well. She does not have additional problems to discuss today.   Most recent fall risk assessment:    12/22/2021    3:20 PM  [place] in the past year? 0  Number falls in past yr: 0  Injury with Fall? 0  Risk for fall due to : No Fall Risks  Follow up Falls evaluation completed     Most recent depression screenings:    12/22/2021    3:20 PM 08/27/2020    3:50 PM  PHQ 2/9 Scores  PHQ - 2 Score 0 0    Vision:Within last year, Dental: No current dental problems and Receives regular dental care, and STD: The patient denies history of sexually transmitted disease.    Patient Care Team: Samuel Bouche, NP as PCP - General (Nurse Practitioner)   Outpatient Medications Prior to Visit  Medication Sig   levothyroxine (SYNTHROID) 75 MCG tablet Take 1 tablet (75 mcg total) by mouth daily before breakfast.   omeprazole (PRILOSEC) 20 MG capsule Take 1 capsule (20 mg total) by mouth daily.   No facility-administered medications prior to visit.   Review of Systems  Constitutional:  Negative for chills, fever, malaise/fatigue and weight loss.  HENT:  Negative for congestion, ear pain, hearing loss, sinus pain and sore throat.   Eyes:  Negative for blurred vision, photophobia and pain.  Respiratory:  Negative for cough, shortness of breath and wheezing.   Cardiovascular:  Negative for chest pain, palpitations and leg swelling.  Gastrointestinal:  Negative for abdominal pain, constipation, diarrhea, heartburn, nausea and vomiting.  Genitourinary:  Negative for  dysuria, frequency and urgency.  Musculoskeletal:  Negative for falls and neck pain.  Skin:  Negative for itching and rash.  Neurological:  Negative for dizziness, weakness and headaches.  Endo/Heme/Allergies:  Negative for polydipsia. Does not bruise/bleed easily.  Psychiatric/Behavioral:  Negative for depression, substance abuse and suicidal ideas. The patient is not nervous/anxious.      Objective:    BP 101/65 (BP Location: Right Arm, Cuff Size: Normal)   Pulse 63   Resp 20   Ht 5' (1.524 m)   Wt 94 lb 1.6 oz (42.7 kg)   SpO2 99%   BMI 18.38 kg/m    Physical Exam Constitutional:      General: She is not in acute distress.    Appearance: Normal appearance. She is underweight. She is not ill-appearing.  HENT:     Head: Normocephalic and atraumatic.     Right Ear: Tympanic membrane normal.     Left Ear: Tympanic membrane normal.     Nose: Nose normal.     Mouth/Throat:     Mouth: Mucous membranes are moist.     Pharynx: No oropharyngeal exudate or posterior oropharyngeal erythema.  Eyes:     Extraocular Movements: Extraocular movements intact.     Conjunctiva/sclera: Conjunctivae normal.     Pupils: Pupils are equal, round, and reactive to light.  Neck:     Thyroid: No thyromegaly.  Vascular: No carotid bruit or JVD.     Trachea: Trachea normal.  Cardiovascular:     Rate and Rhythm: Normal rate and regular rhythm.     Pulses: Normal pulses.     Heart sounds: Normal heart sounds. No murmur heard.    No friction rub. No gallop.  Pulmonary:     Effort: Pulmonary effort is normal. No respiratory distress.     Breath sounds: Normal breath sounds. No wheezing.  Abdominal:     General: Bowel sounds are normal. There is no distension.     Palpations: Abdomen is soft.     Tenderness: There is no abdominal tenderness. There is no guarding.  Musculoskeletal:        General: Normal range of motion.     Cervical back: Normal range of motion and neck supple.  Skin:     General: Skin is warm and dry.  Neurological:     Mental Status: She is alert and oriented to person, place, and time.     Cranial Nerves: No cranial nerve deficit.  Psychiatric:        Mood and Affect: Mood normal.        Behavior: Behavior normal.        Thought Content: Thought content normal.        Judgment: Judgment normal.     No results found for any visits on 12/22/21.     Assessment & Plan:    Routine Health Maintenance and Physical Exam  Immunization History  Administered Date(s) Administered   Influenza-Unspecified 06/05/2020   Moderna Sars-Covid-2 Vaccination 09/14/2019, 10/12/2019   Tdap 08/28/2015    Health Maintenance  Topic Date Due   COVID-19 Vaccine (3 - Moderna series) 12/07/2019   INFLUENZA VACCINE  01/20/2022   PAP SMEAR-Modifier  08/28/2023   TETANUS/TDAP  08/27/2025   COLONOSCOPY (Pts 45-36yr Insurance coverage will need to be confirmed)  12/15/2028   HIV Screening  Completed   HPV VACCINES  Aged Out   Hepatitis C Screening  Discontinued    Discussed health benefits of physical activity, and encouraged her to engage in regular exercise appropriate for her age and condition.  1. Annual physical exam Checking labs as below.  Up-to-date on preventative care.  Wellness information provided with AVS. - CBC with Differential/Platelet - COMPLETE METABOLIC PANEL WITH GFR - Lipid panel  2. Acquired hypothyroidism Checking TSH today.  Continue levothyroxine as ordered, titrate depending on lab results. - TSH  3. History of vitamin D deficiency Checking vitamin D. - VITAMIN D 25 Hydroxy (Vit-D Deficiency, Fractures)  4. Strict vegetarian diet Checking vitamin B12. - Vitamin B12  5. History of iron deficiency Checking iron panel. - Fe+TIBC+Fer  Return in about 1 year (around 12/23/2022) for annual physical exam or sooner if needed.   JSamuel Bouche NP

## 2021-12-22 ENCOUNTER — Other Ambulatory Visit: Payer: Self-pay | Admitting: Medical-Surgical

## 2021-12-22 ENCOUNTER — Encounter: Payer: No Typology Code available for payment source | Admitting: Medical-Surgical

## 2021-12-22 ENCOUNTER — Encounter: Payer: Self-pay | Admitting: Medical-Surgical

## 2021-12-22 ENCOUNTER — Other Ambulatory Visit (HOSPITAL_COMMUNITY): Payer: Self-pay

## 2021-12-22 ENCOUNTER — Ambulatory Visit (INDEPENDENT_AMBULATORY_CARE_PROVIDER_SITE_OTHER): Payer: No Typology Code available for payment source | Admitting: Medical-Surgical

## 2021-12-22 VITALS — BP 101/65 | HR 63 | Resp 20 | Ht 60.0 in | Wt 94.1 lb

## 2021-12-22 DIAGNOSIS — Z8639 Personal history of other endocrine, nutritional and metabolic disease: Secondary | ICD-10-CM

## 2021-12-22 DIAGNOSIS — E039 Hypothyroidism, unspecified: Secondary | ICD-10-CM | POA: Diagnosis not present

## 2021-12-22 DIAGNOSIS — Z0000 Encounter for general adult medical examination without abnormal findings: Secondary | ICD-10-CM

## 2021-12-22 DIAGNOSIS — Z789 Other specified health status: Secondary | ICD-10-CM

## 2021-12-22 DIAGNOSIS — R928 Other abnormal and inconclusive findings on diagnostic imaging of breast: Secondary | ICD-10-CM

## 2021-12-22 MED ORDER — NYSTATIN 100000 UNIT/GM EX POWD
1.0000 | Freq: Three times a day (TID) | CUTANEOUS | 0 refills | Status: DC
  Filled 2021-12-22: qty 15, 5d supply, fill #0

## 2021-12-23 LAB — COMPLETE METABOLIC PANEL WITH GFR
AG Ratio: 1.7 (calc) (ref 1.0–2.5)
ALT: 16 U/L (ref 6–29)
AST: 21 U/L (ref 10–35)
Albumin: 4.7 g/dL (ref 3.6–5.1)
Alkaline phosphatase (APISO): 51 U/L (ref 31–125)
BUN: 10 mg/dL (ref 7–25)
CO2: 29 mmol/L (ref 20–32)
Calcium: 10.5 mg/dL — ABNORMAL HIGH (ref 8.6–10.2)
Chloride: 103 mmol/L (ref 98–110)
Creat: 0.64 mg/dL (ref 0.50–0.99)
Globulin: 2.7 g/dL (calc) (ref 1.9–3.7)
Glucose, Bld: 82 mg/dL (ref 65–99)
Potassium: 4.5 mmol/L (ref 3.5–5.3)
Sodium: 140 mmol/L (ref 135–146)
Total Bilirubin: 0.8 mg/dL (ref 0.2–1.2)
Total Protein: 7.4 g/dL (ref 6.1–8.1)
eGFR: 108 mL/min/{1.73_m2} (ref >=60)

## 2021-12-23 LAB — TSH: TSH: 0.91 mIU/L

## 2021-12-23 LAB — CBC WITH DIFFERENTIAL/PLATELET
Absolute Monocytes: 462 cells/uL (ref 200–950)
Basophils Absolute: 27 cells/uL (ref 0–200)
Basophils Relative: 0.4 %
Eosinophils Absolute: 68 cells/uL (ref 15–500)
Eosinophils Relative: 1 %
HCT: 40.8 % (ref 35.0–45.0)
Hemoglobin: 14.2 g/dL (ref 11.7–15.5)
Lymphs Abs: 1945 cells/uL (ref 850–3900)
MCH: 32 pg (ref 27.0–33.0)
MCHC: 34.8 g/dL (ref 32.0–36.0)
MCV: 91.9 fL (ref 80.0–100.0)
MPV: 11 fL (ref 7.5–12.5)
Monocytes Relative: 6.8 %
Neutro Abs: 4298 cells/uL (ref 1500–7800)
Neutrophils Relative %: 63.2 %
Platelets: 268 10*3/uL (ref 140–400)
RBC: 4.44 10*6/uL (ref 3.80–5.10)
RDW: 11.3 % (ref 11.0–15.0)
Total Lymphocyte: 28.6 %
WBC: 6.8 10*3/uL (ref 3.8–10.8)

## 2021-12-23 LAB — LIPID PANEL
Cholesterol: 202 mg/dL — ABNORMAL HIGH (ref <200)
HDL: 86 mg/dL (ref >=50)
LDL Cholesterol (Calc): 102 mg/dL (calc) — ABNORMAL HIGH
Non-HDL Cholesterol (Calc): 116 mg/dL (calc) (ref <130)
Total CHOL/HDL Ratio: 2.3 (calc) (ref <5.0)
Triglycerides: 54 mg/dL (ref <150)

## 2021-12-23 LAB — IRON,TIBC AND FERRITIN PANEL
%SAT: 24 % (calc) (ref 16–45)
Ferritin: 24 ng/mL (ref 16–232)
Iron: 95 ug/dL (ref 40–190)
TIBC: 394 mcg/dL (calc) (ref 250–450)

## 2021-12-23 LAB — VITAMIN B12: Vitamin B-12: 390 pg/mL (ref 200–1100)

## 2021-12-23 LAB — VITAMIN D 25 HYDROXY (VIT D DEFICIENCY, FRACTURES): Vit D, 25-Hydroxy: 25 ng/mL — ABNORMAL LOW (ref 30–100)

## 2021-12-25 ENCOUNTER — Other Ambulatory Visit (HOSPITAL_COMMUNITY): Payer: Self-pay

## 2021-12-25 ENCOUNTER — Other Ambulatory Visit: Payer: Self-pay | Admitting: Medical-Surgical

## 2021-12-25 MED ORDER — LEVOTHYROXINE SODIUM 75 MCG PO TABS
75.0000 ug | ORAL_TABLET | Freq: Every day | ORAL | 3 refills | Status: DC
  Filled 2021-12-25: qty 90, 90d supply, fill #0
  Filled 2022-04-06: qty 90, 90d supply, fill #1
  Filled 2022-09-15: qty 90, 90d supply, fill #2

## 2021-12-29 ENCOUNTER — Encounter: Payer: Self-pay | Admitting: Medical-Surgical

## 2021-12-30 ENCOUNTER — Other Ambulatory Visit: Payer: No Typology Code available for payment source

## 2021-12-30 ENCOUNTER — Other Ambulatory Visit: Payer: Self-pay

## 2021-12-30 DIAGNOSIS — E349 Endocrine disorder, unspecified: Secondary | ICD-10-CM

## 2021-12-30 NOTE — Progress Notes (Signed)
Order needed to be future

## 2022-01-01 ENCOUNTER — Ambulatory Visit
Admission: RE | Admit: 2022-01-01 | Discharge: 2022-01-01 | Disposition: A | Discharge status: Home / Self Care | Payer: No Typology Code available for payment source | Source: Ambulatory Visit | Attending: Medical-Surgical | Admitting: Medical-Surgical

## 2022-01-01 ENCOUNTER — Ambulatory Visit: Payer: No Typology Code available for payment source

## 2022-01-01 DIAGNOSIS — R928 Other abnormal and inconclusive findings on diagnostic imaging of breast: Secondary | ICD-10-CM

## 2022-01-03 LAB — PTH, INTACT AND CALCIUM
Calcium: 10.4 mg/dL — ABNORMAL HIGH (ref 8.6–10.2)
PTH: 102 pg/mL — ABNORMAL HIGH (ref 16–77)

## 2022-01-05 ENCOUNTER — Encounter: Payer: Self-pay | Admitting: Medical-Surgical

## 2022-02-24 ENCOUNTER — Ambulatory Visit: Payer: No Typology Code available for payment source | Admitting: "Endocrinology

## 2022-02-26 ENCOUNTER — Encounter: Payer: Self-pay | Admitting: "Endocrinology

## 2022-02-26 ENCOUNTER — Ambulatory Visit (INDEPENDENT_AMBULATORY_CARE_PROVIDER_SITE_OTHER): Payer: No Typology Code available for payment source | Admitting: "Endocrinology

## 2022-02-26 DIAGNOSIS — E039 Hypothyroidism, unspecified: Secondary | ICD-10-CM | POA: Insufficient documentation

## 2022-02-26 DIAGNOSIS — E210 Primary hyperparathyroidism: Secondary | ICD-10-CM

## 2022-02-26 NOTE — Progress Notes (Signed)
02/26/2022, 1:01 PM            Endocrinology Consult Note  Jocelyn Lewis is a 51 y.o.-year-old female, referred by her  Samuel Bouche, NP  , for evaluation for hypercalcemia/hyperparathyroidism.   Past Medical History:  Diagnosis Date   GERD (gastroesophageal reflux disease)    Hypertension    Thyroid disease     Past Surgical History:  Procedure Laterality Date   COLONOSCOPY  11/2021   NO PAST SURGERIES      Social History   Tobacco Use   Smoking status: Never   Smokeless tobacco: Never  Vaping Use   Vaping Use: Never used  Substance Use Topics   Alcohol use: Never   Drug use: Never    Family History  Problem Relation Age of Onset   Hypertension Mother    Diabetes Other    Hypertension Other    Colon cancer Neg Hx    Esophageal cancer Neg Hx    Rectal cancer Neg Hx    Stomach cancer Neg Hx     Outpatient Encounter Medications as of 02/26/2022  Medication Sig   Cholecalciferol 125 MCG (5000 UT) capsule Take 5,000 Units by mouth daily.   levothyroxine (SYNTHROID) 75 MCG tablet Take 1 tablet (75 mcg total) by mouth daily before breakfast.   nystatin powder Apply 1 application topically 3 (three) times daily.   [DISCONTINUED] omeprazole (PRILOSEC) 20 MG capsule Take 1 capsule (20 mg total) by mouth daily.   No facility-administered encounter medications on file as of 02/26/2022.    No Known Allergies   HPI  Jocelyn Lewis was diagnosed with hypercalcemia in October 2022 and again in July 2020.  Patient has no previously known history of parathyroid, pituitary, adrenal dysfunctions; no family history of such dysfunctions. -Review of herreferral package of most recent labs reveals calcium of 10.4 the corresponding PTH of 102 on December 30, 2021. She did not have any bone density previously. No prior history of fragility fractures or falls. No history of  kidney stones.  No history of CKD. Last BUN/Cr: 10/0.64.  she is  not on HCTZ or other thiazide therapy.  She has mild vitamin D deficiency, has not been taking any vitamin D supplements.    she is not on calcium supplements,  she eats dairy and green, leafy, vegetables on average amounts.  she does not have a family history of hypercalcemia, pituitary tumors, thyroid cancer, or osteoporosis.   Her other medical problems include hypertension and hypothyroidism which she was diagnosed with at approximate age of 67 currently on levothyroxine 75 mcg p.o. daily before breakfast.  She gets her thyroid hormone properly.   ROS:  Constitutional: + Mildly fluctuating body weight,  no fatigue, no subjective hyperthermia, no subjective hypothermia Eyes: no blurry vision, no xerophthalmia ENT: no sore throat, no nodules palpated in throat, no dysphagia/odynophagia, no hoarseness Cardiovascular: no Chest Pain, no Shortness of Breath, no palpitations, no leg swelling Respiratory: no cough, no shortness of breath  Gastrointestinal: no Nausea/Vomiting/Diarhhea Musculoskeletal: no muscle/joint aches Skin: no rashes Neurological: no tremors, no numbness, no tingling, no dizziness Psychiatric: no depression, no anxiety  PE: BP 90/68   Pulse 68  Ht 5' (1.524 m)   Wt 96 lb 9.6 oz (43.8 kg)   BMI 18.87 kg/m , Body mass index is 18.87 kg/m. Wt Readings from Last 3 Encounters:  02/26/22 96 lb 9.6 oz (43.8 kg)  12/22/21 94 lb 1.6 oz (42.7 kg)  12/15/21 95 lb (43.1 kg)    Constitutional: + Light build with a BMI of 18.87 kg per meter squared, not in acute distress, normal state of mind Eyes: PERRLA, EOMI, no exophthalmos ENT: moist mucous membranes, no gross thyromegaly, no gross cervical lymphadenopathy Cardiovascular: normal precordial activity, Regular Rate and Rhythm, no Murmur/Rubs/Gallops Respiratory:  adequate breathing efforts, no gross chest deformity, Clear to auscultation bilaterally Gastrointestinal: abdomen soft, Non -tender, No distension, Bowel Sounds  present Musculoskeletal: no gross deformities, strength intact in all four extremities Skin: moist, warm, no rashes Neurological: no tremor with outstretched hands, Deep tendon reflexes normal in bilateral lower extremities.     CMP ( most recent) CMP     Component Value Date/Time   NA 140 12/22/2021 0000   K 4.5 12/22/2021 0000   CL 103 12/22/2021 0000   CO2 29 12/22/2021 0000   GLUCOSE 82 12/22/2021 0000   BUN 10 12/22/2021 0000   CREATININE 0.64 12/22/2021 0000   CALCIUM 10.4 (H) 12/30/2021 1201   PROT 7.4 12/22/2021 0000   AST 21 12/22/2021 0000   ALT 16 12/22/2021 0000   BILITOT 0.8 12/22/2021 0000   GFRNONAA 108 08/29/2020 0705   GFRAA 125 08/29/2020 0705      Lipid Panel ( most recent) Lipid Panel     Component Value Date/Time   CHOL 202 (H) 12/22/2021 0000   TRIG 54 12/22/2021 0000   HDL 86 12/22/2021 0000   CHOLHDL 2.3 12/22/2021 0000   LDLCALC 102 (H) 12/22/2021 0000      Lab Results  Component Value Date   TSH 0.91 12/22/2021   TSH 1.75 07/15/2021   TSH 18.75 (H) 04/14/2021   TSH 3.86 01/17/2021   TSH 0.31 (L) 08/29/2020      Assessment: 1. Hypercalcemia / Hyperparathyroidism  Plan: Patient has had at least 2 instances of elevated calcium,  with the highest level being at 10.9 mg/dL.  More recently she did have a serum calcium of 10.4 with a corresponding intact PTH level was also high, at 102.  - Patient also  has mild vitamin D deficiency.  She will be initiated on vitamin D supplements 5000 units of vitamin D3 daily.     - No apparent complications from hypercalcemia/hyperparathyroidism: no history of  nephrolithiasis,  osteoporosis,fragility fractures. No abdominal pain, no major mood disorders, no bone pain.  - I discussed with the patient about the physiology of calcium and parathyroid hormone, and possible  effects of  increased PTH/ Calcium , including kidney stones, cardiac dysrhythmias, osteoporosis, abdominal pain, etc.   - The work  up so far is not sufficient to reach a conclusion for definitive therapy.  she  needs more studies to confirm and classify the parathyroid dysfunction she may have. I will proceed to obtain 24-hour urine and creatinine measurement bone density.   It is essential to obtain 24-hour urine calcium/creatinine to rule out the rare but important cause of mild elevation in calcium and PTH- [place] ( Familial Hypocalciuric Hypercalcemia), which may not require any active intervention.  - I will request for her next DEXA scan to include the distal  33% of  radius for evaluation of cortical bone, which is predominantly affected by hyperparathyroidism.  For her hypothyroidism, she is advised to continue levothyroxine 75 mcg p.o. daily before breakfast.  - We discussed about the correct intake of her thyroid hormone, on empty stomach at fasting, with water, separated by at least 30 minutes from breakfast and other medications,  and separated by more than 4 hours from calcium, iron, multivitamins, acid reflux medications (PPIs). -Patient is made aware of the fact that thyroid hormone replacement is needed for life, dose to be adjusted by periodic monitoring of thyroid function tests.   She will return in 2 weeks with her results.  If she is confirmed to have primary hyperparathyroidism, she will be considered for surgical intervention.  She is advised to follow-up with her PCP for primary care needs.  - Time spent with the patient: 50 minutes, of which >50% was spent in obtaining information about her symptoms, reviewing her previous labs, evaluations, and treatments, counseling her about her hypercalcemia, hyperparathyroidism, hypothyroidism, and developing a plan to confirm the diagnosis and long term treatment as necessary.  Please refer to " Patient Self Inventory" in the Media  tab for reviewed elements of pertinent patient history.  Renee Rival participated in the discussions, expressed understanding, and  voiced agreement with the above plans.  All questions were answered to her satisfaction. she is encouraged to contact clinic should she have any questions or concerns prior to her return visit.  - Return in about 2 weeks (around 03/12/2022) for DXA Scan B4 NV, 24 Hr Urine Ca & Cr.   Glade Lloyd, MD [facility] [address] [place], [place] [address] Phone: [phone]  Fax: [phone]    This note was partially dictated with voice recognition software. Similar sounding words can be transcribed inadequately or may not  be corrected upon review.  02/26/2022, 1:01 PM

## 2022-03-03 LAB — CREATININE, URINE, 24 HOUR
Creatinine, 24H Ur: 310 mg/24 hr — ABNORMAL LOW (ref 800–1800)
Creatinine, Urine: 15.5 mg/dL

## 2022-03-04 LAB — CALCIUM, URINE, 24 HOUR
Calcium, 24H Urine: 118 mg/24 hr (ref 0–320)
Calcium, Urine: 5.9 mg/dL

## 2022-03-16 ENCOUNTER — Ambulatory Visit: Payer: No Typology Code available for payment source | Admitting: "Endocrinology

## 2022-03-26 ENCOUNTER — Other Ambulatory Visit: Payer: No Typology Code available for payment source

## 2022-03-30 ENCOUNTER — Ambulatory Visit: Payer: No Typology Code available for payment source | Admitting: "Endocrinology

## 2022-04-01 ENCOUNTER — Ambulatory Visit: Payer: No Typology Code available for payment source | Admitting: "Endocrinology

## 2022-04-06 ENCOUNTER — Other Ambulatory Visit (HOSPITAL_BASED_OUTPATIENT_CLINIC_OR_DEPARTMENT_OTHER): Payer: Self-pay

## 2022-04-06 ENCOUNTER — Other Ambulatory Visit (HOSPITAL_COMMUNITY): Payer: Self-pay

## 2022-04-13 ENCOUNTER — Other Ambulatory Visit (HOSPITAL_COMMUNITY): Payer: Self-pay

## 2022-04-14 ENCOUNTER — Telehealth: Payer: Self-pay | Admitting: "Endocrinology

## 2022-04-14 NOTE — Telephone Encounter (Signed)
Patient would like her DEXA order to go to [facility], fax number is [phone]. She is going to cancel the one at the other place

## 2022-04-15 NOTE — Telephone Encounter (Signed)
Scheduled

## 2022-04-16 ENCOUNTER — Other Ambulatory Visit: Payer: No Typology Code available for payment source

## 2022-04-21 ENCOUNTER — Other Ambulatory Visit: Payer: No Typology Code available for payment source

## 2022-04-29 ENCOUNTER — Ambulatory Visit (INDEPENDENT_AMBULATORY_CARE_PROVIDER_SITE_OTHER): Payer: No Typology Code available for payment source

## 2022-04-30 ENCOUNTER — Ambulatory Visit (INDEPENDENT_AMBULATORY_CARE_PROVIDER_SITE_OTHER): Payer: No Typology Code available for payment source | Admitting: "Endocrinology

## 2022-04-30 ENCOUNTER — Encounter: Payer: Self-pay | Admitting: "Endocrinology

## 2022-04-30 DIAGNOSIS — M85832 Other specified disorders of bone density and structure, left forearm: Secondary | ICD-10-CM

## 2022-04-30 DIAGNOSIS — E039 Hypothyroidism, unspecified: Secondary | ICD-10-CM | POA: Diagnosis not present

## 2022-04-30 DIAGNOSIS — E210 Primary hyperparathyroidism: Secondary | ICD-10-CM

## 2022-04-30 DIAGNOSIS — E782 Mixed hyperlipidemia: Secondary | ICD-10-CM

## 2022-04-30 NOTE — Progress Notes (Signed)
04/30/2022, 9:53 PM             Endocrinology follow-up note  Jocelyn Lewis is a 51 y.o.-year-old female, referred by her  Samuel Bouche, NP  , for evaluation for hypercalcemia/hyperparathyroidism.   Past Medical History:  Diagnosis Date   GERD (gastroesophageal reflux disease)    Hypertension    Thyroid disease     Past Surgical History:  Procedure Laterality Date   COLONOSCOPY  11/2021   NO PAST SURGERIES      Social History   Tobacco Use   Smoking status: Never   Smokeless tobacco: Never  Vaping Use   Vaping Use: Never used  Substance Use Topics   Alcohol use: Never   Drug use: Never    Family History  Problem Relation Age of Onset   Hypertension Mother    Diabetes Other    Hypertension Other    Colon cancer Neg Hx    Esophageal cancer Neg Hx    Rectal cancer Neg Hx    Stomach cancer Neg Hx     Outpatient Encounter Medications as of 04/30/2022  Medication Sig   Cholecalciferol 125 MCG (5000 UT) capsule Take 5,000 Units by mouth daily.   levothyroxine (SYNTHROID) 75 MCG tablet Take 1 tablet (75 mcg total) by mouth daily before breakfast.   [DISCONTINUED] nystatin powder Apply 1 application topically 3 (three) times daily.   No facility-administered encounter medications on file as of 04/30/2022.    No Known Allergies   HPI  Jocelyn Lewis was diagnosed with hypercalcemia in October 2022 and again in July 2020.  Patient has no previously known history of parathyroid, pituitary, adrenal dysfunctions; no family history of such dysfunctions. -Review of herreferral package of most recent labs reveals calcium of 10.4 the corresponding PTH of 102 on December 30, 2021. Her 24 hour urine calcium is 118.  She did not have any bone density previously. No prior history of fragility fractures or falls. No history of  kidney stones.  No history of CKD. Last BUN/Cr: 10/0.64.  she is not on HCTZ or other thiazide therapy.   She has mild vitamin D deficiency, has not been taking any vitamin D supplements.    she is not on calcium supplements,  she eats dairy and green, leafy, vegetables on average amounts.  she does not have a family history of hypercalcemia, pituitary tumors, thyroid cancer, or osteoporosis.   Her other medical problems include hypertension and hypothyroidism which she was diagnosed with at approximate age of 72 currently on levothyroxine 75 mcg p.o. daily before breakfast.  She gets her thyroid hormone properly.   ROS:  Constitutional: + Mildly fluctuating body weight,  no fatigue, no subjective hyperthermia, no subjective hypothermia Eyes: no blurry vision, no xerophthalmia ENT: no sore throat, no nodules palpated in throat, no dysphagia/odynophagia, no hoarseness Cardiovascular: no Chest Pain, no Shortness of Breath, no palpitations, no leg swelling Respiratory: no cough, no shortness of breath  Gastrointestinal: no Nausea/Vomiting/Diarhhea Musculoskeletal: no muscle/joint aches Skin: no rashes Neurological: no tremors, no numbness, no tingling, no dizziness Psychiatric: no depression, no anxiety  PE: BP 98/62   Pulse 76   Ht 5' (1.524 m)  Wt 96 lb (43.5 kg)   BMI 18.75 kg/m , Body mass index is 18.75 kg/m. Wt Readings from Last 3 Encounters:  04/30/22 96 lb (43.5 kg)  02/26/22 96 lb 9.6 oz (43.8 kg)  12/22/21 94 lb 1.6 oz (42.7 kg)    Constitutional: + Light build with a BMI of 18.87 kg per meter squared, not in acute distress, normal state of mind Eyes: PERRLA, EOMI, no exophthalmos ENT: moist mucous membranes, no gross thyromegaly, no gross cervical lymphadenopathy Cardiovascular: normal precordial activity, Regular Rate and Rhythm, no Murmur/Rubs/Gallops Respiratory:  adequate breathing efforts, no gross chest deformity, Clear to auscultation bilaterally Gastrointestinal: abdomen soft, Non -tender, No distension, Bowel Sounds present Musculoskeletal: no gross  deformities, strength intact in all four extremities Skin: moist, warm, no rashes Neurological: no tremor with outstretched hands, Deep tendon reflexes normal in bilateral lower extremities.     CMP ( most recent) CMP     Component Value Date/Time   NA 140 12/22/2021 0000   K 4.5 12/22/2021 0000   CL 103 12/22/2021 0000   CO2 29 12/22/2021 0000   GLUCOSE 82 12/22/2021 0000   BUN 10 12/22/2021 0000   CREATININE 0.64 12/22/2021 0000   CALCIUM 10.4 (H) 12/30/2021 1201   PROT 7.4 12/22/2021 0000   AST 21 12/22/2021 0000   ALT 16 12/22/2021 0000   BILITOT 0.8 12/22/2021 0000   GFRNONAA 108 08/29/2020 0705   GFRAA 125 08/29/2020 0705      Lipid Panel ( most recent) Lipid Panel     Component Value Date/Time   CHOL 202 (H) 12/22/2021 0000   TRIG 54 12/22/2021 0000   HDL 86 12/22/2021 0000   CHOLHDL 2.3 12/22/2021 0000   LDLCALC 102 (H) 12/22/2021 0000      Lab Results  Component Value Date   TSH 0.91 12/22/2021   TSH 1.75 07/15/2021   TSH 18.75 (H) 04/14/2021   TSH 3.86 01/17/2021   TSH 0.31 (L) 08/29/2020    DXA on 04/29/2022  ASSESSMENT: The BMD measured at Forearm Radius 33% is 0.712 g/cm2 with a T-score of -1.9. This patient is considered osteopenic according to [facility] [facility]) criteria. The scan quality is good.   Site Region Measured Date Measured Age WHO YA BMD Classification T-score AP Spine L1-L4 04/29/2022 49.5 Low Bone Mass -1.1 1.054 g/cm2   DualFemur Neck Right 6/29/2022 49.5 [age] Low Bone Mass -1.4 0.850 g/cm2   Left Forearm Radius 33% 04/29/2022 49.5 Low Bone Mass -1.9 0.712 g/cm2    Assessment: 1. Hypercalcemia / Hyperparathyroidism 2.  Osteopenia 3.  Hyperlipidemia 4.  Hypothyroidism  Plan: Patient has had at least 2 instances of elevated calcium,  with the highest level being at 10.9 mg/dL.  More recently she did have a serum calcium of 10.4 with a corresponding intact PTH level was also high, at 102.  - Patient also   has mild vitamin D deficiency.  She will be initiated on vitamin D supplements 5000 units of vitamin D3 daily.    24-hour urine calcium was 118 prior to this visit.  She also underwent bone density which showed T score of -1.9 on left forearm.  Her studies are consistent with osteopenia.  Apparent complication seems to be osteopenia with mild hypercalcemia.  She has no history of fragility fractures, nor nephrolithiasis.  - I discussed with the patient about the physiology of calcium and parathyroid hormone, and possible  effects of  increased PTH/ Calcium , including kidney stones, cardiac dysrhythmias,  osteoporosis, abdominal pain, etc.   She is still consistent with mild, early primary hyperparathyroidism.  However, she is not a surgical candidate at this time. -Observation approach until next measurement in 6 months.   For her hypothyroidism, she is advised to continue levothyroxine 75 mcg p.o. daily before breakfast.  - We discussed about the correct intake of her thyroid hormone, on empty stomach at fasting, with water, separated by at least 30 minutes from breakfast and other medications,  and separated by more than 4 hours from calcium, iron, multivitamins, acid reflux medications (PPIs). -Patient is made aware of the fact that thyroid hormone replacement is needed for life, dose to be adjusted by periodic monitoring of thyroid function tests.  For hyperlipidemia indicated by LDL of 102, she hesitates to go on medications for now.  Whole food plant-based diet was discussed and recommended to her.  She will have fasting lipid panel along with her next labs.  She does not have hypertension.  -Her next presentation confirms significant primary hyperparathyroidism, she will be considered for surgical intervention. She is advised to follow-up with her PCP for primary care needs.   I spent 26  minutes in the care of the patient today including review of labs from Thyroid Function, CMP, and  other relevant labs ; imaging/biopsy records (current and previous including abstractions from other facilities); face-to-face time discussing  her lab results and symptoms, medications doses, her options of short and long term treatment based on the latest standards of care / guidelines;   and documenting the encounter.  Renee Rival  participated in the discussions, expressed understanding, and voiced agreement with the above plans.  All questions were answered to her satisfaction. she is encouraged to contact clinic should she have any questions or concerns prior to her return visit.   - Return in about 6 months (around 10/29/2022) for Fasting Labs  in AM B4 8.   Glade Lloyd, MD [facility] [address] [place], [place] [address] Phone: [phone]  Fax: [phone]    This note was partially dictated with voice recognition software. Similar sounding words can be transcribed inadequately or may not  be corrected upon review.  04/30/2022, 9:53 PM

## 2022-05-19 ENCOUNTER — Other Ambulatory Visit: Payer: No Typology Code available for payment source

## 2022-09-15 ENCOUNTER — Other Ambulatory Visit (HOSPITAL_COMMUNITY): Payer: Self-pay

## 2022-10-19 ENCOUNTER — Encounter: Payer: Self-pay | Admitting: "Endocrinology

## 2022-10-21 DIAGNOSIS — E039 Hypothyroidism, unspecified: Secondary | ICD-10-CM | POA: Diagnosis not present

## 2022-10-21 DIAGNOSIS — E782 Mixed hyperlipidemia: Secondary | ICD-10-CM | POA: Diagnosis not present

## 2022-10-22 LAB — LIPID PANEL
Chol/HDL Ratio: 2.5 ratio (ref 0.0–4.4)
Cholesterol, Total: 209 mg/dL — ABNORMAL HIGH (ref 100–199)
HDL: 84 mg/dL (ref >39)
LDL Chol Calc (NIH): 115 mg/dL — ABNORMAL HIGH (ref 0–99)
Triglycerides: 55 mg/dL (ref 0–149)
VLDL Cholesterol Cal: 10 mg/dL (ref 5–40)

## 2022-10-22 LAB — T4, FREE: Free T4: 1.98 ng/dL — ABNORMAL HIGH (ref 0.82–1.77)

## 2022-10-22 LAB — PTH, INTACT AND CALCIUM
Calcium: 10.6 mg/dL — ABNORMAL HIGH (ref 8.7–10.2)
PTH: 58 pg/mL (ref 15–65)

## 2022-10-22 LAB — TSH: TSH: 0.12 u[IU]/mL — ABNORMAL LOW (ref 0.450–4.500)

## 2022-10-29 ENCOUNTER — Ambulatory Visit (INDEPENDENT_AMBULATORY_CARE_PROVIDER_SITE_OTHER): Payer: 59 | Admitting: "Endocrinology

## 2022-10-29 ENCOUNTER — Other Ambulatory Visit (HOSPITAL_COMMUNITY): Payer: Self-pay

## 2022-10-29 ENCOUNTER — Encounter: Payer: Self-pay | Admitting: "Endocrinology

## 2022-10-29 DIAGNOSIS — E782 Mixed hyperlipidemia: Secondary | ICD-10-CM | POA: Diagnosis not present

## 2022-10-29 DIAGNOSIS — M85832 Other specified disorders of bone density and structure, left forearm: Secondary | ICD-10-CM

## 2022-10-29 DIAGNOSIS — E039 Hypothyroidism, unspecified: Secondary | ICD-10-CM | POA: Diagnosis not present

## 2022-10-29 MED ORDER — LEVOTHYROXINE SODIUM 50 MCG PO TABS
50.0000 ug | ORAL_TABLET | Freq: Every day | ORAL | 1 refills | Status: DC
  Filled 2022-10-29: qty 90, 90d supply, fill #0

## 2022-10-29 MED ORDER — ROSUVASTATIN CALCIUM 5 MG PO TABS
5.0000 mg | ORAL_TABLET | Freq: Every day | ORAL | 3 refills | Status: AC
  Filled 2022-10-29: qty 90, 90d supply, fill #0
  Filled 2023-02-17 – 2023-03-02 (×2): qty 90, 90d supply, fill #1
  Filled 2023-04-28: qty 90, 90d supply, fill #2

## 2022-10-29 NOTE — Progress Notes (Signed)
10/29/2022, 5:26 PM             Endocrinology follow-up note  Jocelyn Lewis is a 52 y.o.-year-old female, referred by her  Jocelyn Butter, NP  , for evaluation for hypercalcemia/hyperparathyroidism.   Past Medical History:  Diagnosis Date   GERD (gastroesophageal reflux disease)    Hypertension    Thyroid disease     Past Surgical History:  Procedure Laterality Date   COLONOSCOPY  11/2021   NO PAST SURGERIES      Social History   Tobacco Use   Smoking status: Never   Smokeless tobacco: Never  Vaping Use   Vaping Use: Never used  Substance Use Topics   Alcohol use: Never   Drug use: Never    Family History  Problem Relation Age of Onset   Hypertension Mother    Diabetes Other    Hypertension Other    Colon cancer Neg Hx    Esophageal cancer Neg Hx    Rectal cancer Neg Hx    Stomach cancer Neg Hx     Outpatient Encounter Medications as of 10/29/2022  Medication Sig   rosuvastatin (CRESTOR) 5 MG tablet Take 1 tablet (5 mg) by mouth daily.   Cholecalciferol 125 MCG (5000 UT) capsule Take 5,000 Units by mouth daily.   levothyroxine (SYNTHROID) 50 MCG tablet Take 1 tablet (50 mcg) by mouth daily before breakfast.   [DISCONTINUED] levothyroxine (SYNTHROID) 75 MCG tablet Take 1 tablet (75 mcg total) by mouth daily before breakfast.   No facility-administered encounter medications on file as of 10/29/2022.    No Known Allergies   HPI  Jocelyn Lewis was diagnosed with hypercalcemia in October 2022 and again in July 2020.  Patient has no previously known history of parathyroid, pituitary, adrenal dysfunctions; no family history of such dysfunctions. She was put on expectant management with repeat of her labs.  She returns with calcium 10.6, PTH improved to 58 from 102. Her 24 hour urine calcium is 118.  She was documented to have osteopenia on DEXA scan done in November 2023. No prior history of fragility fractures or falls.  No history of  kidney stones.  No history of CKD. Last BUN/Cr: 10/0.64.  she is not on HCTZ or other thiazide therapy.  She has mild vitamin D deficiency, has not been taking any vitamin D supplements.    she is not on calcium supplements,  she eats dairy and green, leafy, vegetables on average amounts.  she does not have a family history of hypercalcemia, pituitary tumors, thyroid cancer, or osteoporosis.   Her previsit labs show dyslipidemia, uncontrolled.  Her other medical problems include hypertension and hypothyroidism which she was diagnosed with at approximate age of 70 currently on levothyroxine 75 mcg p.o. daily before breakfast.  Her previsit thyroid function tests are consistent with slight over-replacement.      ROS:  Constitutional: + Mildly fluctuating body weight,  no fatigue, no subjective hyperthermia, no subjective hypothermia Eyes: no blurry vision, no xerophthalmia ENT: no sore throat, no nodules palpated in throat, no dysphagia/odynophagia, no hoarseness   PE: BP 94/64   Pulse 68   Ht 5' (1.524 m)   Wt 95 lb  9.6 oz (43.4 kg)   BMI 18.67 kg/m , Body mass index is 18.67 kg/m. Wt Readings from Last 3 Encounters:  10/29/22 95 lb 9.6 oz (43.4 kg)  04/30/22 96 lb (43.5 kg)  02/26/22 96 lb 9.6 oz (43.8 kg)    Constitutional: + Light build with a BMI of 18.87 kg per meter squared, not in acute distress, normal state of mind Eyes: PERRLA, EOMI, no exophthalmos ENT: moist mucous membranes, no gross thyromegaly, no gross cervical lymphadenopathy Cardiovascular: normal precordial activity, Regular Rate and Rhythm, no Murmur/Rubs/Gallops    CMP ( most recent) CMP     Component Value Date/Time   NA 140 12/22/2021 0000   K 4.5 12/22/2021 0000   CL 103 12/22/2021 0000   CO2 29 12/22/2021 0000   GLUCOSE 82 12/22/2021 0000   BUN 10 12/22/2021 0000   CREATININE 0.64 12/22/2021 0000   CALCIUM 10.6 (H) 10/21/2022 0733   PROT 7.4 12/22/2021 0000   AST 21  12/22/2021 0000   ALT 16 12/22/2021 0000   BILITOT 0.8 12/22/2021 0000   GFRNONAA 108 08/29/2020 0705   GFRAA 125 08/29/2020 0705      Lipid Panel ( most recent) Lipid Panel     Component Value Date/Time   CHOL 209 (H) 10/21/2022 0733   TRIG 55 10/21/2022 0733   HDL 84 10/21/2022 0733   CHOLHDL 2.5 10/21/2022 0733   CHOLHDL 2.3 12/22/2021 0000   LDLCALC 115 (H) 10/21/2022 0733   LDLCALC 102 (H) 12/22/2021 0000   LABVLDL 10 10/21/2022 0733      Lab Results  Component Value Date   TSH 0.120 (L) 10/21/2022   TSH 0.91 12/22/2021   TSH 1.75 07/15/2021   TSH 18.75 (H) 04/14/2021   TSH 3.86 01/17/2021   TSH 0.31 (L) 08/29/2020   FREET4 1.98 (H) 10/21/2022    DXA on 04/29/2022  ASSESSMENT: The BMD measured at Forearm Radius 33% is 0.712 g/cm2 with a T-score of -1.9. This patient is considered osteopenic according to World Health Organization [facility]) criteria. The scan quality is good.   Site Region Measured Date Measured Age WHO YA BMD Classification T-score AP Spine L1-L4 04/29/2022 49.5 Low Bone Mass -1.1 1.054 g/cm2   DualFemur Neck Right 6/29/2022 49.5 [age] Low Bone Mass -1.4 0.850 g/cm2   Left Forearm Radius 33% 04/29/2022 49.5 Low Bone Mass -1.9 0.712 g/cm2    Assessment: 1. Hypercalcemia / Hyperparathyroidism 2.  Osteopenia 3.  Hyperlipidemia 4.  Hypothyroidism  Plan: Patient has had at least 3 instances of elevated calcium,  with the highest level being at 10.9 mg/dL, and highest PTH of 161.  More recent, previsit labs show calcium at 10.6 PTH at 58.   - Patient also  has mild vitamin D deficiency.  She is advised to continue vitamin D3 5000 units daily.  Her vitamin D is now replete at 25.    24-hour urine calcium was 118 prior to this visit.  She also underwent bone density which showed T score of -1.9 on left forearm.  Her studies are consistent with osteopenia. This is still consistent with mild, early, primary hyperparathyroidism with apparent  complication osteopenia and mild hypercalcemia.  She has no history of fragility fractures, nor nephrolithiasis.  - I discussed with the patient about the physiology of calcium and parathyroid hormone, and possible  effects of  increased PTH/ Calcium , including kidney stones, cardiac dysrhythmias, osteoporosis, abdominal pain, etc.   She has presentation which is  still consistent with mild,  early primary hyperparathyroidism.  However, she is not a surgical candidate at this time. She will be put on extended observation without intervention.  She will need repeat labs in 6 months with office visit.   For her hypothyroidism, her previsit labs are consistent with slight over-replacement.  I discussed and lowered her levothyroxine to 50 mcg p.o. daily before breakfast.    - We discussed about the correct intake of her thyroid hormone, on empty stomach at fasting, with water, separated by at least 30 minutes from breakfast and other medications,  and separated by more than 4 hours from calcium, iron, multivitamins, acid reflux medications (PPIs). -Patient is made aware of the fact that thyroid hormone replacement is needed for life, dose to be adjusted by periodic monitoring of thyroid function tests.  Her hyperlipidemia is getting worse at 115 from 102.  She has been hesitant to go on medications, however at this time since she has maximized her lifestyle changes she is open for prescription for low-dose statins.  I discussed and prescribed Crestor 5 mg p.o. nightly.  Side effects and precautions discussed with her.   She does not have hypertension.  -If her next  presentation confirms significant primary hyperparathyroidism, she will be considered for surgical intervention.  She is advised to follow-up with her PCP for primary care needs.   I spent  25  minutes in the care of the patient today including review of labs from Thyroid Function, CMP, and other relevant labs ; imaging/biopsy records  (current and previous including abstractions from other facilities); face-to-face time discussing  her lab results and symptoms, medications doses, her options of short and long term treatment based on the latest standards of care / guidelines;   and documenting the encounter.  Ailene Rud  participated in the discussions, expressed understanding, and voiced agreement with the above plans.  All questions were answered to her satisfaction. she is encouraged to contact clinic should she have any questions or concerns prior to her return visit.   - Return in about 6 months (around 05/01/2023) for Fasting Labs  in AM B4 8.   Marquis Lunch, MD [facility] [address] [place], [place] [address] Phone: [phone]  Fax: [phone]    This note was partially dictated with voice recognition software. Similar sounding words can be transcribed inadequately or may not  be corrected upon review.  10/29/2022, 5:26 PM

## 2022-10-30 ENCOUNTER — Other Ambulatory Visit (HOSPITAL_COMMUNITY): Payer: Self-pay

## 2022-11-02 ENCOUNTER — Other Ambulatory Visit (HOSPITAL_COMMUNITY): Payer: Self-pay

## 2022-11-02 ENCOUNTER — Other Ambulatory Visit: Payer: Self-pay | Admitting: "Endocrinology

## 2022-11-03 ENCOUNTER — Other Ambulatory Visit (HOSPITAL_COMMUNITY): Payer: Self-pay

## 2022-11-03 MED ORDER — LEVOTHYROXINE SODIUM 50 MCG PO TABS
50.0000 ug | ORAL_TABLET | Freq: Every day | ORAL | 1 refills | Status: DC
  Filled 2022-11-03: qty 90, 90d supply, fill #0
  Filled 2023-01-27: qty 90, 90d supply, fill #1

## 2022-11-04 ENCOUNTER — Other Ambulatory Visit (HOSPITAL_COMMUNITY): Payer: Self-pay

## 2022-12-21 ENCOUNTER — Other Ambulatory Visit: Payer: Self-pay | Admitting: Medical-Surgical

## 2022-12-21 DIAGNOSIS — Z1231 Encounter for screening mammogram for malignant neoplasm of breast: Secondary | ICD-10-CM

## 2022-12-25 ENCOUNTER — Other Ambulatory Visit (HOSPITAL_COMMUNITY): Payer: Self-pay

## 2022-12-25 ENCOUNTER — Encounter: Payer: Self-pay | Admitting: Medical-Surgical

## 2022-12-25 ENCOUNTER — Ambulatory Visit (INDEPENDENT_AMBULATORY_CARE_PROVIDER_SITE_OTHER): Payer: Self-pay

## 2022-12-25 ENCOUNTER — Ambulatory Visit (INDEPENDENT_AMBULATORY_CARE_PROVIDER_SITE_OTHER): Payer: 59 | Admitting: Medical-Surgical

## 2022-12-25 VITALS — BP 108/73 | HR 71 | Temp 97.6°F | Ht 60.0 in | Wt 95.0 lb

## 2022-12-25 DIAGNOSIS — Z789 Other specified health status: Secondary | ICD-10-CM

## 2022-12-25 DIAGNOSIS — Z8639 Personal history of other endocrine, nutritional and metabolic disease: Secondary | ICD-10-CM | POA: Diagnosis not present

## 2022-12-25 DIAGNOSIS — E039 Hypothyroidism, unspecified: Secondary | ICD-10-CM | POA: Diagnosis not present

## 2022-12-25 DIAGNOSIS — E559 Vitamin D deficiency, unspecified: Secondary | ICD-10-CM | POA: Diagnosis not present

## 2022-12-25 DIAGNOSIS — E7800 Pure hypercholesterolemia, unspecified: Secondary | ICD-10-CM

## 2022-12-25 DIAGNOSIS — Z0000 Encounter for general adult medical examination without abnormal findings: Secondary | ICD-10-CM

## 2022-12-25 DIAGNOSIS — Z13220 Encounter for screening for lipoid disorders: Secondary | ICD-10-CM | POA: Diagnosis not present

## 2022-12-25 DIAGNOSIS — E210 Primary hyperparathyroidism: Secondary | ICD-10-CM | POA: Diagnosis not present

## 2022-12-25 DIAGNOSIS — Z23 Encounter for immunization: Secondary | ICD-10-CM | POA: Diagnosis not present

## 2022-12-25 LAB — CBC WITH DIFFERENTIAL/PLATELET
Basophils Relative: 1.1 %
Eosinophils Relative: 1.8 %
Lymphs Abs: 1184 cells/uL (ref 850–3900)
MCH: 31.3 pg (ref 27.0–33.0)
MCV: 94 fL (ref 80.0–100.0)
RDW: 11.6 % (ref 11.0–15.0)
Total Lymphocyte: 26.9 %

## 2022-12-25 MED ORDER — SHINGRIX 50 MCG/0.5ML IM SUSR
0.5000 mL | Freq: Once | INTRAMUSCULAR | 0 refills | Status: DC
  Filled 2022-12-25: qty 0.5, 1d supply, fill #0

## 2022-12-25 NOTE — Addendum Note (Signed)
Addended by: Hendricks Milo on: 12/25/2022 09:36 AM   Modules accepted: Orders

## 2022-12-25 NOTE — Addendum Note (Signed)
Addended byChristen Butter on: 12/25/2022 09:33 AM   Modules accepted: Orders

## 2022-12-25 NOTE — Progress Notes (Signed)
Complete physical exam  Patient: Jocelyn Lewis   DOB: 12-06-1970   52 y.o. Female  MRN: [id number]  Subjective:    Chief Complaint  Patient presents with   Annual Exam    Pt here for annual exam    Jocelyn Lewis is a 52 y.o. female who presents today for a complete physical exam. She reports consuming a  vegan  diet.  Walking daily for 30-60 minutes.  She generally feels well. She reports sleeping well. She does not have additional problems to discuss today.    Most recent fall risk assessment:    12/25/2022    8:42 AM  Fall Risk   Falls in the past year? 0  Number falls in past yr: 0  Injury with Fall? 0  Risk for fall due to : No Fall Risks  Follow up Falls evaluation completed     Most recent depression screenings:    12/25/2022    8:43 AM 12/22/2021    3:20 PM  PHQ 2/9 Scores  PHQ - 2 Score 0 0  Exception Documentation Medical reason     Vision:Within last year, Dental: No current dental problems and Receives regular dental care, and STD: The patient denies history of sexually transmitted disease.    Patient Care Team: Christen Butter, NP as PCP - General (Nurse Practitioner)   Outpatient Medications Prior to Visit  Medication Sig   Cholecalciferol 125 MCG (5000 UT) capsule Take 5,000 Units by mouth daily.   levothyroxine (SYNTHROID) 50 MCG tablet Take 1 tablet (50 mcg) by mouth daily before breakfast.   rosuvastatin (CRESTOR) 5 MG tablet Take 1 tablet (5 mg) by mouth daily.   No facility-administered medications prior to visit.   Review of Systems  Constitutional:  Negative for chills, fever, malaise/fatigue and weight loss.  HENT:  Negative for congestion, ear pain, hearing loss, sinus pain and sore throat.   Eyes:  Negative for blurred vision, photophobia and pain.  Respiratory:  Negative for cough, shortness of breath and wheezing.   Cardiovascular:  Negative for chest pain, palpitations and leg swelling.  Gastrointestinal:  Negative for abdominal pain,  constipation, diarrhea, heartburn, nausea and vomiting.       Intermittent left lower quadrant discomfort  Genitourinary:  Negative for dysuria, frequency and urgency.  Musculoskeletal:  Negative for falls and neck pain.  Skin:  Negative for itching and rash.  Neurological:  Negative for dizziness, weakness and headaches.  Endo/Heme/Allergies:  Negative for polydipsia. Does not bruise/bleed easily.  Psychiatric/Behavioral:  Negative for depression, substance abuse and suicidal ideas. The patient is not nervous/anxious and does not have insomnia.       Objective:    BP 108/73 (BP Location: Left Arm, Patient Position: Sitting, Cuff Size: Normal)   Pulse 71   Temp 97.6 F (36.4 C) (Oral)   Ht 5' (1.524 m)   Wt 95 lb 0.8 oz (43.1 kg)   SpO2 100%   BMI 18.56 kg/m    Physical Exam Vitals reviewed.  Constitutional:      General: She is not in acute distress.    Appearance: Normal appearance. She is normal weight. She is not ill-appearing.  HENT:     Head: Normocephalic and atraumatic.     Right Ear: Tympanic membrane, ear canal and external ear normal. There is no impacted cerumen.     Left Ear: Tympanic membrane, ear canal and external ear normal. There is no impacted cerumen.     Nose: Nose normal. No congestion  or rhinorrhea.     Mouth/Throat:     Mouth: Mucous membranes are moist.     Pharynx: No oropharyngeal exudate or posterior oropharyngeal erythema.  Eyes:     General: No scleral icterus.       Right eye: No discharge.        Left eye: No discharge.     Extraocular Movements: Extraocular movements intact.     Conjunctiva/sclera: Conjunctivae normal.     Pupils: Pupils are equal, round, and reactive to light.  Neck:     Thyroid: No thyromegaly.     Vascular: No carotid bruit or JVD.     Trachea: Trachea normal.  Cardiovascular:     Rate and Rhythm: Normal rate and regular rhythm.     Pulses: Normal pulses.     Heart sounds: Normal heart sounds. No murmur heard.     No friction rub. No gallop.  Pulmonary:     Effort: Pulmonary effort is normal. No respiratory distress.     Breath sounds: Normal breath sounds. No wheezing.  Abdominal:     General: Bowel sounds are normal. There is no distension.     Palpations: Abdomen is soft.     Tenderness: There is no abdominal tenderness. There is no guarding.  Musculoskeletal:        General: Normal range of motion.     Cervical back: Normal range of motion and neck supple.  Lymphadenopathy:     Cervical: No cervical adenopathy.  Skin:    General: Skin is warm and dry.  Neurological:     Mental Status: She is alert and oriented to person, place, and time.     Cranial Nerves: No cranial nerve deficit.  Psychiatric:        Mood and Affect: Mood normal.        Behavior: Behavior normal.        Thought Content: Thought content normal.        Judgment: Judgment normal.      No results found for any visits on 12/25/22.     Assessment & Plan:    Routine Health Maintenance and Physical Exam  Immunization History  Administered Date(s) Administered   Influenza-Unspecified 06/05/2020   Moderna Sars-Covid-2 Vaccination 09/14/2019, 10/12/2019   Tdap 08/28/2015    Health Maintenance  Topic Date Due   COVID-19 Vaccine (3 - 2023-24 season) 02/20/2022   Zoster Vaccines- Shingrix (1 of 2) Never done   HIV Screening  12/25/2023 (Originally 10/07/1985)   INFLUENZA VACCINE  01/21/2023   PAP SMEAR-Modifier  08/28/2023   MAMMOGRAM  12/19/2023   DTaP/Tdap/Td (2 - Td or Tdap) 08/27/2025   Colonoscopy  12/15/2028   HPV VACCINES  Aged Out   Hepatitis C Screening  Discontinued    Discussed health benefits of physical activity, and encouraged her to engage in regular exercise appropriate for her age and condition.  1. Annual physical exam Checking labs as below.  Up-to-date on preventative care.  Wellness information provided in AVS. - CBC with Differential/Platelet - COMPLETE METABOLIC PANEL WITH GFR - Lipid  panel  2. Lipid screening Checking lipids today. - Lipid panel  3. Hyperparathyroidism, primary (HCC) 4. Hypothyroidism, unspecified type 5. Hypercalcemia Managed by endocrinology.  6. Vegan diet Checking iron panel and vitamin B12. - Iron, TIBC and Ferritin Panel - Vitamin B12  7. History of vitamin D deficiency Checking vitamin D. - VITAMIN D 25 Hydroxy (Vit-D Deficiency, Fractures)  8. Elevated cholesterol Patient recently started on Crestor 5  mg daily by her endocrinologist however is questioning the need for this medication.  Rechecking lipids as above.  Ordering CT cardiac calcium scoring per patient request. - CT CARDIAC SCORING (SELF PAY ONLY); Future  Return in about 1 year (around 12/25/2023) for annual physical exam or sooner if needed.   Christen Butter, NP

## 2022-12-26 LAB — COMPLETE METABOLIC PANEL WITH GFR
AG Ratio: 1.7 (calc) (ref 1.0–2.5)
ALT: 23 U/L (ref 6–29)
AST: 25 U/L (ref 10–35)
Albumin: 4.3 g/dL (ref 3.6–5.1)
Alkaline phosphatase (APISO): 62 U/L (ref 37–153)
BUN: 9 mg/dL (ref 7–25)
CO2: 28 mmol/L (ref 20–32)
Calcium: 10.2 mg/dL (ref 8.6–10.4)
Chloride: 105 mmol/L (ref 98–110)
Creat: 0.61 mg/dL (ref 0.50–1.03)
Globulin: 2.5 g/dL (calc) (ref 1.9–3.7)
Glucose, Bld: 93 mg/dL (ref 65–99)
Potassium: 4.3 mmol/L (ref 3.5–5.3)
Sodium: 141 mmol/L (ref 135–146)
Total Bilirubin: 1.7 mg/dL — ABNORMAL HIGH (ref 0.2–1.2)
Total Protein: 6.8 g/dL (ref 6.1–8.1)
eGFR: 109 mL/min/{1.73_m2} (ref >=60)

## 2022-12-26 LAB — CBC WITH DIFFERENTIAL/PLATELET
Absolute Monocytes: 414 cells/uL (ref 200–950)
Basophils Absolute: 48 cells/uL (ref 0–200)
Eosinophils Absolute: 79 cells/uL (ref 15–500)
HCT: 42 % (ref 35.0–45.0)
Hemoglobin: 14 g/dL (ref 11.7–15.5)
MCHC: 33.3 g/dL (ref 32.0–36.0)
MPV: 10.7 fL (ref 7.5–12.5)
Monocytes Relative: 9.4 %
Neutro Abs: 2675 cells/uL (ref 1500–7800)
Neutrophils Relative %: 60.8 %
Platelets: 214 10*3/uL (ref 140–400)
RBC: 4.47 10*6/uL (ref 3.80–5.10)
WBC: 4.4 10*3/uL (ref 3.8–10.8)

## 2022-12-26 LAB — IRON,TIBC AND FERRITIN PANEL
%SAT: 41 % (calc) (ref 16–45)
Ferritin: 14 ng/mL — ABNORMAL LOW (ref 16–232)
Iron: 157 ug/dL (ref 45–160)
TIBC: 381 mcg/dL (calc) (ref 250–450)

## 2022-12-26 LAB — LIPID PANEL
Cholesterol: 143 mg/dL (ref <200)
HDL: 74 mg/dL (ref >=50)
LDL Cholesterol (Calc): 55 mg/dL (calc)
Non-HDL Cholesterol (Calc): 69 mg/dL (calc) (ref <130)
Total CHOL/HDL Ratio: 1.9 (calc) (ref <5.0)
Triglycerides: 49 mg/dL (ref <150)

## 2022-12-26 LAB — VITAMIN D 25 HYDROXY (VIT D DEFICIENCY, FRACTURES): Vit D, 25-Hydroxy: 68 ng/mL (ref 30–100)

## 2022-12-26 LAB — VITAMIN B12: Vitamin B-12: 353 pg/mL (ref 200–1100)

## 2023-01-27 ENCOUNTER — Other Ambulatory Visit: Payer: Self-pay | Admitting: Medical-Surgical

## 2023-01-27 ENCOUNTER — Ambulatory Visit: Payer: 59

## 2023-01-27 ENCOUNTER — Ambulatory Visit
Admission: RE | Admit: 2023-01-27 | Discharge: 2023-01-27 | Disposition: A | Discharge status: Home / Self Care | Payer: 59 | Source: Ambulatory Visit | Attending: Medical-Surgical | Admitting: Medical-Surgical

## 2023-01-27 DIAGNOSIS — Z1231 Encounter for screening mammogram for malignant neoplasm of breast: Secondary | ICD-10-CM

## 2023-02-17 ENCOUNTER — Encounter: Payer: Self-pay | Admitting: Medical-Surgical

## 2023-02-17 ENCOUNTER — Ambulatory Visit: Payer: 59

## 2023-02-17 DIAGNOSIS — E039 Hypothyroidism, unspecified: Secondary | ICD-10-CM

## 2023-02-26 ENCOUNTER — Other Ambulatory Visit (HOSPITAL_COMMUNITY): Payer: Self-pay

## 2023-03-01 DIAGNOSIS — E039 Hypothyroidism, unspecified: Secondary | ICD-10-CM | POA: Diagnosis not present

## 2023-03-02 ENCOUNTER — Other Ambulatory Visit: Payer: Self-pay | Admitting: "Endocrinology

## 2023-03-02 ENCOUNTER — Encounter: Payer: Self-pay | Admitting: "Endocrinology

## 2023-03-02 ENCOUNTER — Other Ambulatory Visit (HOSPITAL_COMMUNITY): Payer: Self-pay

## 2023-03-02 LAB — TSH+FREE T4
Free T4: 1.53 ng/dL (ref 0.82–1.77)
TSH: 6.42 u[IU]/mL — ABNORMAL HIGH (ref 0.450–4.500)

## 2023-03-02 MED ORDER — LEVOTHYROXINE SODIUM 125 MCG PO TABS
62.5000 ug | ORAL_TABLET | Freq: Every day | ORAL | 1 refills | Status: AC
  Filled 2023-03-02: qty 45, 90d supply, fill #0
  Filled 2023-04-28: qty 45, 90d supply, fill #1

## 2023-03-03 ENCOUNTER — Other Ambulatory Visit (HOSPITAL_COMMUNITY): Payer: Self-pay

## 2023-04-28 ENCOUNTER — Other Ambulatory Visit (HOSPITAL_COMMUNITY): Payer: Self-pay

## 2023-04-29 ENCOUNTER — Other Ambulatory Visit (HOSPITAL_COMMUNITY): Payer: Self-pay

## 2023-04-29 ENCOUNTER — Other Ambulatory Visit: Payer: Self-pay

## 2023-05-18 ENCOUNTER — Ambulatory Visit: Payer: 59 | Admitting: "Endocrinology

## 2023-07-07 ENCOUNTER — Other Ambulatory Visit (HOSPITAL_COMMUNITY): Payer: Self-pay
